# Patient Record
Sex: Male | Born: 1965 | Race: White | Hispanic: No | Smoking: Current every day smoker
Health system: Southern US, Community
[De-identification: ages and names within clinical notes are randomized; demographics above are authoritative.]

## PROBLEM LIST (undated history)

## (undated) DIAGNOSIS — K219 Gastro-esophageal reflux disease without esophagitis: Secondary | ICD-10-CM

## (undated) DIAGNOSIS — G8929 Other chronic pain: Secondary | ICD-10-CM

## (undated) DIAGNOSIS — K760 Fatty (change of) liver, not elsewhere classified: Secondary | ICD-10-CM

## (undated) DIAGNOSIS — M549 Dorsalgia, unspecified: Secondary | ICD-10-CM

## (undated) HISTORY — DX: Gastro-esophageal reflux disease without esophagitis: K21.9

## (undated) HISTORY — DX: Other chronic pain: G89.29

## (undated) HISTORY — PX: WISDOM TOOTH EXTRACTION: SHX21

## (undated) HISTORY — DX: Fatty (change of) liver, not elsewhere classified: K76.0

## (undated) HISTORY — PX: KNEE SURGERY: SHX244

## (undated) HISTORY — PX: BACK SURGERY: SHX140

## (undated) HISTORY — DX: Dorsalgia, unspecified: M54.9

## (undated) HISTORY — PX: APPENDECTOMY: SHX54

---

## 1998-01-31 ENCOUNTER — Emergency Department (HOSPITAL_COMMUNITY): Admission: EM | Admit: 1998-01-31 | Discharge: 1998-01-31 | Payer: Self-pay | Admitting: Emergency Medicine

## 2001-07-19 ENCOUNTER — Inpatient Hospital Stay (HOSPITAL_COMMUNITY): Admission: EM | Admit: 2001-07-19 | Discharge: 2001-07-22 | Payer: Self-pay

## 2001-07-20 ENCOUNTER — Encounter: Payer: Self-pay | Admitting: Orthopaedic Surgery

## 2014-11-14 ENCOUNTER — Ambulatory Visit: Payer: Self-pay

## 2014-11-14 DIAGNOSIS — M25521 Pain in right elbow: Secondary | ICD-10-CM | POA: Diagnosis not present

## 2014-11-14 DIAGNOSIS — S5001XA Contusion of right elbow, initial encounter: Secondary | ICD-10-CM | POA: Diagnosis not present

## 2014-11-14 DIAGNOSIS — M25421 Effusion, right elbow: Secondary | ICD-10-CM | POA: Diagnosis not present

## 2014-11-17 ENCOUNTER — Other Ambulatory Visit: Payer: Worker's Compensation

## 2014-11-17 DIAGNOSIS — S5001XD Contusion of right elbow, subsequent encounter: Secondary | ICD-10-CM | POA: Diagnosis not present

## 2014-11-24 ENCOUNTER — Other Ambulatory Visit: Payer: Worker's Compensation

## 2014-11-24 DIAGNOSIS — M25521 Pain in right elbow: Secondary | ICD-10-CM | POA: Diagnosis not present

## 2014-11-24 DIAGNOSIS — S5001XD Contusion of right elbow, subsequent encounter: Secondary | ICD-10-CM | POA: Diagnosis not present

## 2014-11-29 ENCOUNTER — Ambulatory Visit (INDEPENDENT_AMBULATORY_CARE_PROVIDER_SITE_OTHER): Payer: Worker's Compensation | Admitting: Family Medicine

## 2014-11-29 VITALS — BP 108/78 | HR 75 | Temp 98.2°F | Resp 16 | Ht 68.0 in | Wt 148.8 lb

## 2014-11-29 DIAGNOSIS — S5401XS Injury of ulnar nerve at forearm level, right arm, sequela: Secondary | ICD-10-CM | POA: Diagnosis not present

## 2014-11-29 NOTE — Progress Notes (Signed)
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 Distribution.  He has been taking the medrol dospak and seeing some improvement.  He has been limiting his use of the right arm and keeping it padded.  Objective:  Right elbow shows no ecchymosis or swelling.  He has tenderness right medial epicondyle. Right arm shows full range of motion  Assessment:  Good improvement  Plan:  Continue the padding Finish the Medrol  Recheck March 25  Friday  Robyn Haber, MD

## 2016-08-16 ENCOUNTER — Ambulatory Visit (INDEPENDENT_AMBULATORY_CARE_PROVIDER_SITE_OTHER): Payer: BLUE CROSS/BLUE SHIELD | Admitting: Family Medicine

## 2016-08-16 VITALS — BP 116/70 | HR 85 | Temp 97.6°F | Resp 18 | Ht 68.0 in | Wt 142.0 lb

## 2016-08-16 DIAGNOSIS — R0989 Other specified symptoms and signs involving the circulatory and respiratory systems: Secondary | ICD-10-CM | POA: Diagnosis not present

## 2016-08-16 DIAGNOSIS — F17200 Nicotine dependence, unspecified, uncomplicated: Secondary | ICD-10-CM | POA: Insufficient documentation

## 2016-08-16 DIAGNOSIS — R059 Cough, unspecified: Secondary | ICD-10-CM

## 2016-08-16 DIAGNOSIS — R05 Cough: Secondary | ICD-10-CM

## 2016-08-16 DIAGNOSIS — Z720 Tobacco use: Secondary | ICD-10-CM

## 2016-08-16 DIAGNOSIS — Z716 Tobacco abuse counseling: Secondary | ICD-10-CM | POA: Diagnosis not present

## 2016-08-16 DIAGNOSIS — J069 Acute upper respiratory infection, unspecified: Secondary | ICD-10-CM

## 2016-08-16 DIAGNOSIS — H9203 Otalgia, bilateral: Secondary | ICD-10-CM

## 2016-08-16 MED ORDER — NICOTINE 21 MG/24HR TD PT24: 21.0000 mg | MEDICATED_PATCH | Freq: Every day | TRANSDERMAL | 0 refills | Status: DC

## 2016-08-16 MED ORDER — NICOTINE 7 MG/24HR TD PT24: 7.0000 mg | MEDICATED_PATCH | Freq: Every day | TRANSDERMAL | 0 refills | Status: DC

## 2016-08-16 MED ORDER — FLUTICASONE PROPIONATE 50 MCG/ACT NA SUSP: 2.0000 | Freq: Every day | NASAL | 6 refills | Status: DC

## 2016-08-16 MED ORDER — NICOTINE 14 MG/24HR TD PT24: 14.0000 mg | MEDICATED_PATCH | Freq: Every day | TRANSDERMAL | 0 refills | Status: DC

## 2016-08-16 MED ORDER — CETIRIZINE HCL 10 MG PO CHEW: 10.0000 mg | CHEWABLE_TABLET | Freq: Every day | ORAL | 0 refills | Status: DC

## 2016-08-16 NOTE — Progress Notes (Signed)
Chief Complaint  Patient presents with  . Ear Pain    both at times, mainly left/x1 wk  . Dizziness    x 1 wk  . Nausea    x 1wk    HPI   Pt reports that he smokes 1 pack a day for the past 36 years. Reports that he has not smoked that much because his acute respiratory illness makes cigarettes taste bad. He tried cold [place] to quit in the past but reports his withdrawal was so bad he went back to smoking. He has heard bad things about chantix and is afraid to take  He feels like he would love to quit smoking He tends to smoke after eating  He now has a cough, dizziness, nausea and ear pain for one week The cough has been worse in the past couple days He reports that he is coughing up phlegm Denies any fevers or chills Reports a runny nose     No past medical history on file.  Current Outpatient Prescriptions  Medication Sig Dispense Refill  . cetirizine (ZYRTEC) 10 MG chewable tablet Chew 1 tablet (10 mg total) by mouth daily. 30 tablet 0  . fluticasone (FLONASE) 50 MCG/ACT nasal spray Place 2 sprays into both nostrils daily. 16 g 6  . MethylPREDNISolone (MEDROL, PAK, PO) Take by mouth. 8 day pak, as directed    . nicotine (NICODERM CQ - DOSED IN MG/24 HOURS) 14 mg/24hr patch Place 1 patch (14 mg total) onto the skin daily. Step 2. Use for 3 weeks. 28 patch 0  . nicotine (NICODERM CQ - DOSED IN MG/24 HOURS) 21 mg/24hr patch Place 1 patch (21 mg total) onto the skin daily. For 6 weeks 56 patch 0  . nicotine (NICODERM CQ - DOSED IN MG/24 HR) 7 mg/24hr patch Place 1 patch (7 mg total) onto the skin daily. Step 3. For 1-2 weeks 28 patch 0   No current facility-administered medications for this visit.     Allergies:  Allergies  Allergen Reactions  . Tamiflu [Oseltamivir] Hives    Past Surgical History:  Procedure Laterality Date  . APPENDECTOMY    . BACK SURGERY    . KNEE SURGERY      Social History   Social History  . Marital status: Legally Separated   Spouse name: N/A  . Number of children: N/A  . Years of education: N/A   Social History Main Topics  . Smoking status: Current Every Day Smoker    Packs/day: 0.50    Years: 30.00    Types: Cigarettes  . Smokeless tobacco: None  . Alcohol use No  . Drug use: No  . Sexual activity: Not Asked   Other Topics Concern  . None   Social History Narrative  . None    Review of Systems  Constitutional: Negative for chills and fever.  HENT: Positive for ear pain. Negative for congestion, ear discharge, hearing loss, sinus pain and sore throat.   Eyes: Negative for blurred vision and double vision.  Respiratory: Negative for cough, shortness of breath, wheezing and stridor.   Cardiovascular: Negative for chest pain, palpitations and orthopnea.  Gastrointestinal: Negative for abdominal pain, nausea and vomiting.  Skin: Negative for itching and rash.  Psychiatric/Behavioral: Negative for depression. The patient is not nervous/anxious.     Objective: Vitals:   08/16/16 0851  BP: 116/70  Pulse: 85  Resp: 18  Temp: 97.6 F (36.4 C)  TempSrc: Oral  SpO2: 98%  Weight: 142 lb (  64.4 kg)  Height: 5\' 8"  (1.727 m)    Physical Exam General: alert, oriented, in NAD Head: normocephalic, atraumatic, no sinus tenderness Eyes: EOM intact, no scleral icterus or conjunctival injection Ears: TM clear bilaterally but bulging TM on the right Throat: no pharyngeal exudate or erythema Lymph: no posterior auricular, submental or cervical lymph adenopathy Heart: normal rate, normal sinus rhythm, no murmurs Lungs: clear to auscultation bilaterally, no wheezing    Assessment and Plan Jesse King was seen today for ear pain, dizziness and nausea.  Diagnoses and all orders for this visit:   Cough- likely due to viral URI  Otalgia of both ears Runny nose -  Advised flonase and zyrtec  Encounter for smoking cessation counseling Tobacco use disorder-  discussed smoking cessation for 15  minutes Discussed usage and barriers to smoking Discussed common set backs Discussed how to use the patches Compared chantix, zyban and patches  Acute URI- likely viral and allergic rhinitis Discussed symptoms Continue smoking cessation Other orders -     nicotine (NICODERM CQ - DOSED IN MG/24 HOURS) 21 mg/24hr patch; Place 1 patch (21 mg total) onto the skin daily. For 6 weeks -     nicotine (NICODERM CQ - DOSED IN MG/24 HOURS) 14 mg/24hr patch; Place 1 patch (14 mg total) onto the skin daily. Step 2. Use for 3 weeks. -     nicotine (NICODERM CQ - DOSED IN MG/24 HR) 7 mg/24hr patch; Place 1 patch (7 mg total) onto the skin daily. Step 3. For 1-2 weeks -     cetirizine (ZYRTEC) 10 MG chewable tablet; Chew 1 tablet (10 mg total) by mouth daily. -     fluticasone (FLONASE) 50 MCG/ACT nasal spray; Place 2 sprays into both nostrils daily.     [name]

## 2016-08-16 NOTE — Patient Instructions (Addendum)
IF you received an x-ray today, you will receive an invoice from [facility]. Please contact [facility] at [phone] with questions or concerns regarding your invoice.   IF you received labwork today, you will receive an invoice from [employer]. Please contact [facility] at [phone] with questions or concerns regarding your invoice.   Our billing staff will not be able to assist you with questions regarding bills from these companies.  You will be contacted with the lab results as soon as they are available. The fastest way to get your results is to activate your My Chart account. Instructions are located on the last page of this paperwork. If you have not heard from [place] regarding the results in 2 weeks, please contact this office.      Steps to Quit Smoking Smoking tobacco can be bad for your health. It can also affect almost every organ in your body. Smoking puts you and people around you at risk for many serious long-lasting (chronic) diseases. Quitting smoking is hard, but it is one of the best things that you can do for your health. It is never too late to quit. What are the benefits of quitting smoking? When you quit smoking, you lower your risk for getting serious diseases and conditions. They can include:  Lung cancer or lung disease.  Heart disease.  Stroke.  Heart attack.  Not being able to have children (infertility).  Weak bones (osteoporosis) and broken bones (fractures). If you have coughing, wheezing, and shortness of breath, those symptoms may get better when you quit. You may also get sick less often. If you are pregnant, quitting smoking can help to lower your chances of having a baby of low birth weight. What can I do to help me quit smoking? Talk with your doctor about what can help you quit smoking. Some things you can do (strategies) include:  Quitting smoking totally, instead of slowly cutting back how  much you smoke over a period of time.  Going to in-person counseling. You are more likely to quit if you go to many counseling sessions.  Using resources and support systems, such as:  Online chats with a Social worker.  Phone quitlines.  Printed Furniture conservator/restorer.  Support groups or group counseling.  Text messaging programs.  Mobile phone apps or applications.  Taking medicines. Some of these medicines may have nicotine in them. If you are pregnant or breastfeeding, do not take any medicines to quit smoking unless your doctor says it is okay. Talk with your doctor about counseling or other things that can help you. Talk with your doctor about using more than one strategy at the same time, such as taking medicines while you are also going to in-person counseling. This can help make quitting easier. What things can I do to make it easier to quit? Quitting smoking might feel very hard at first, but there is a lot that you can do to make it easier. Take these steps:  Talk to your family and friends. Ask them to support and encourage you.  Call phone quitlines, reach out to support groups, or work with a Social worker.  Ask people who smoke to not smoke around you.  Avoid places that make you want (trigger) to smoke, such as:  Bars.  Parties.  Smoke-break areas at work.  Spend time with people who do not smoke.  Lower the stress in your life. Stress can make you want to smoke. Try these things to  help your stress:  Getting regular exercise.  Deep-breathing exercises.  Yoga.  Meditating.  Doing a body scan. To do this, close your eyes, focus on one area of your body at a time from head to toe, and notice which parts of your body are tense. Try to relax the muscles in those areas.  Download or buy apps on your mobile phone or tablet that can help you stick to your quit plan. There are many free apps, such as QuitGuide from the [employer] Office manager for Disease Control and Prevention).  You can find more support from smokefree.gov and other websites. This information is not intended to replace advice given to you by your health care provider. Make sure you discuss any questions you have with your health care provider. Document Released: 06/26/2009 Document Revised: 04/27/2016 Document Reviewed: 01/14/2015 Elsevier Interactive Patient Education  2017 Elsevier Inc. Nicotine skin patches What is this medicine? NICOTINE ([place] teen) helps people stop smoking. The patches replace the nicotine found in cigarettes and help to decrease withdrawal effects. They are most effective when used in combination with a stop-smoking program. This medicine may be used for other purposes; ask your health care provider or pharmacist if you have questions. COMMON BRAND NAME(S): Habitrol, Nicoderm CQ, Nicotrol What should I tell my health care provider before I take this medicine? They need to know if you have any of these conditions: -diabetes -heart disease, angina, irregular heartbeat or previous heart attack -high blood pressure -lung disease, including asthma -overactive thyroid -pheochromocytoma -seizures or a history of seizures -skin problems, like eczema -stomach problems or ulcers -an unusual or allergic reaction to nicotine, adhesives, other medicines, foods, dyes, or preservatives -pregnant or trying to get pregnant -breast-feeding How should I use this medicine? This medicine is for use on the skin. Follow the directions that come with the patches. Find an area of skin on your upper arm, chest, or back that is clean, dry, greaseless, undamaged and hairless. Wash hands with plain soap and water. Do not use anything that contains aloe, lanolin or glycerin as these may prevent the patch from sticking. Dry thoroughly. Remove the patch from the sealed pouch. Do not try to cut or trim the patch. Using your palm, press the patch firmly in place for 10 seconds to make sure that there is  good contact with your skin. After applying the patch, wash your hands. Change the patch every day, keeping to a regular schedule. When you apply a new patch, use a new area of skin. Wait at least 1 week before using the same area again. Talk to your pediatrician regarding the use of this medicine in children. Special care may be needed. Overdosage: If you think you have taken too much of this medicine contact a poison control center or emergency room at once. NOTE: This medicine is only for you. Do not share this medicine with others. What if I miss a dose? If you forget to replace a patch, use it as soon as you can. Only use one patch at a time and do not leave on the skin for longer than directed. If a patch falls off, you can replace it, but keep to your schedule and remove the patch at the right time. What may interact with this medicine? -medicines for asthma -medicines for blood pressure -medicines for mental depression This list may not describe all possible interactions. Give your health care provider a list of all the medicines, herbs, non-prescription drugs, or dietary supplements  you use. Also tell them if you smoke, drink alcohol, or use illegal drugs. Some items may interact with your medicine. What should I watch for while using this medicine? You should begin using the nicotine patch the day you stop smoking. It is okay if you do not succeed at your attempt to quit and have a cigarette. You can still continue your quit attempt and keep using the product as directed. Just throw away your cigarettes and get back to your quit plan. You can keep the patch in place during swimming, bathing, and showering. If your patch falls off during these activities, replace it. When you first apply the patch, your skin may itch or burn. This should go away soon. When you remove a patch, the skin may look red, but this should only last for a few days. Call your doctor or health care professional if skin  redness does not go away after 4 days, if your skin swells, or if you get a rash. If you are a diabetic and you quit smoking, the effects of insulin may be increased and you may need to reduce your insulin dose. Check with your doctor or health care professional about how you should adjust your insulin dose. If you are going to have a magnetic resonance imaging (MRI) procedure, tell your MRI technician if you have this patch on your body. It must be removed before a MRI. What side effects may I notice from receiving this medicine? Side effects that you should report to your doctor or health care professional as soon as possible: -allergic reactions like skin rash, itching or hives, swelling of the face, lips, or tongue -breathing problems -changes in hearing -changes in vision -chest pain -cold sweats -confusion -fast, irregular heartbeat -feeling faint or lightheaded, falls -headache -increased saliva -skin redness that lasts more than 4 days -stomach pain -signs and symptoms of nicotine overdose like nausea; vomiting; dizziness; weakness; and rapid heartbeat Side effects that usually do not require medical attention (report to your doctor or health care professional if they continue or are bothersome): -diarrhea -dry mouth -hiccups -irritability -nervousness or restlessness -trouble sleeping or vivid dreams This list may not describe all possible side effects. Call your doctor for medical advice about side effects. You may report side effects to FDA at 1-800-FDA-1088. Where should I keep my medicine? Keep out of the reach of children. Store at room temperature between 20 and 25 degrees C (68 and 77 degrees F). Protect from heat and light. Store in International aid/development worker until ready to use. Throw away unused medicine after the expiration date. When you remove a patch, fold with sticky sides together; put in an empty opened pouch and throw away. NOTE: This sheet is a summary. It may  not cover all possible information. If you have questions about this medicine, talk to your doctor, pharmacist, or health care provider.  2017 Elsevier/Gold Standard (2014-07-29 15:46:21)

## 2016-09-29 ENCOUNTER — Ambulatory Visit: Payer: Self-pay | Admitting: Family Medicine

## 2016-11-05 ENCOUNTER — Encounter (HOSPITAL_COMMUNITY): Payer: Self-pay | Admitting: Emergency Medicine

## 2016-11-05 ENCOUNTER — Emergency Department (HOSPITAL_COMMUNITY): Payer: BLUE CROSS/BLUE SHIELD

## 2016-11-05 ENCOUNTER — Emergency Department (HOSPITAL_COMMUNITY)
Admission: EM | Admit: 2016-11-05 | Discharge: 2016-11-05 | Disposition: A | Discharge status: Home / Self Care | Payer: BLUE CROSS/BLUE SHIELD | Attending: Emergency Medicine | Admitting: Emergency Medicine

## 2016-11-05 DIAGNOSIS — R1032 Left lower quadrant pain: Secondary | ICD-10-CM | POA: Insufficient documentation

## 2016-11-05 DIAGNOSIS — Z79899 Other long term (current) drug therapy: Secondary | ICD-10-CM | POA: Insufficient documentation

## 2016-11-05 DIAGNOSIS — F17210 Nicotine dependence, cigarettes, uncomplicated: Secondary | ICD-10-CM | POA: Insufficient documentation

## 2016-11-05 DIAGNOSIS — R109 Unspecified abdominal pain: Secondary | ICD-10-CM

## 2016-11-05 LAB — COMPREHENSIVE METABOLIC PANEL
ALK PHOS: 60 U/L (ref 38–126)
ALT: 13 U/L — AB (ref 17–63)
AST: 14 U/L — AB (ref 15–41)
Albumin: 4.2 g/dL (ref 3.5–5.0)
Anion gap: 6 (ref 5–15)
BILIRUBIN TOTAL: 0.8 mg/dL (ref 0.3–1.2)
BUN: 11 mg/dL (ref 6–20)
CALCIUM: 9.3 mg/dL (ref 8.9–10.3)
CO2: 26 mmol/L (ref 22–32)
CREATININE: 0.76 mg/dL (ref 0.61–1.24)
Chloride: 108 mmol/L (ref 101–111)
GFR calc Af Amer: >60 mL/min (ref >60)
Glucose, Bld: 94 mg/dL (ref 65–99)
Potassium: 3.8 mmol/L (ref 3.5–5.1)
Sodium: 140 mmol/L (ref 135–145)
Total Protein: 7.3 g/dL (ref 6.5–8.1)

## 2016-11-05 LAB — URINALYSIS, ROUTINE W REFLEX MICROSCOPIC
Bilirubin Urine: NEGATIVE
GLUCOSE, UA: NEGATIVE mg/dL
HGB URINE DIPSTICK: NEGATIVE
KETONES UR: NEGATIVE mg/dL
LEUKOCYTES UA: NEGATIVE
Nitrite: NEGATIVE
PROTEIN: NEGATIVE mg/dL
Specific Gravity, Urine: 1.019 (ref 1.005–1.030)
pH: 5 (ref 5.0–8.0)

## 2016-11-05 LAB — CBC
HCT: 41.4 % (ref 39.0–52.0)
Hemoglobin: 14 g/dL (ref 13.0–17.0)
MCH: 32.1 pg (ref 26.0–34.0)
MCHC: 33.8 g/dL (ref 30.0–36.0)
MCV: 95 fL (ref 78.0–100.0)
PLATELETS: 301 10*3/uL (ref 150–400)
RBC: 4.36 MIL/uL (ref 4.22–5.81)
RDW: 13.4 % (ref 11.5–15.5)
WBC: 7.9 10*3/uL (ref 4.0–10.5)

## 2016-11-05 LAB — LIPASE, BLOOD: Lipase: 26 U/L (ref 11–51)

## 2016-11-05 MED ORDER — MORPHINE SULFATE (PF) 4 MG/ML IV SOLN
4.0000 mg | Freq: Once | INTRAVENOUS | Status: AC
  Administered 2016-11-05: 4 mg via INTRAVENOUS
  Filled 2016-11-05: qty 1

## 2016-11-05 MED ORDER — ONDANSETRON HCL 4 MG/2ML IJ SOLN
4.0000 mg | Freq: Once | INTRAMUSCULAR | Status: AC
  Administered 2016-11-05: 4 mg via INTRAVENOUS
  Filled 2016-11-05: qty 2

## 2016-11-05 MED ORDER — NAPROXEN 375 MG PO TABS: 375.0000 mg | ORAL_TABLET | Freq: Two times a day (BID) | ORAL | 0 refills | Status: DC

## 2016-11-05 NOTE — ED Notes (Signed)
RN will collect labs at time of IV start 

## 2016-11-05 NOTE — ED Triage Notes (Signed)
Pt complaint of left flank pain onset an hour ago with associated nausea. Pt denies v/d or urinary symptoms. Pt denies hx of kidney.

## 2016-11-05 NOTE — ED Provider Notes (Signed)
[facility] DEPT Provider Note   CSN: [id number] Arrival date & time: 11/05/16  1036     History   Chief Complaint Chief Complaint  Patient presents with  . Flank Pain    HPI Jesse King is a 51 y.o. male.  Patient is a 51 year old male with abrupt onset of severe sharp and knifelike pain that is 8 out of 10 that started suddenly approximately 90 minutes ago. It is on his left side and does not radiate. He denies any urinary or bowel issues. Since the pain started he's been slightly nauseated. He denies ever having pain like this before. He does not take any medications regularly and has not taken anything for this pain. It is slightly better than when it first began but is still a 6 out of 10. He denies any shortness of breath or chest pain. No lower extremity symptoms and pain does not radiate to the testicle. He denies any lifting or trauma prior to the pain starting   The history is provided by the patient.  Flank Pain     History reviewed. No pertinent past medical history.  Patient Active Problem List   Diagnosis Date Noted  . Nicotine addiction 08/16/2016  . Tobacco use disorder 08/16/2016    Past Surgical History:  Procedure Laterality Date  . APPENDECTOMY    . BACK SURGERY    . KNEE SURGERY         Home Medications    Prior to Admission medications   Medication Sig Start Date End Date Taking? Authorizing Provider  cetirizine (ZYRTEC) 10 MG chewable tablet Chew 1 tablet (10 mg total) by mouth daily. Patient not taking: Reported on 11/05/2016 08/16/16   Forrest Moron, MD  fluticasone (FLONASE) 50 MCG/ACT nasal spray Place 2 sprays into both nostrils daily. Patient not taking: Reported on 11/05/2016 08/16/16   Forrest Moron, MD  nicotine (NICODERM CQ - DOSED IN MG/24 HOURS) 14 mg/24hr patch Place 1 patch (14 mg total) onto the skin daily. Step 2. Use for 3 weeks. Patient not taking: Reported on 11/05/2016 08/16/16   Forrest Moron, MD  nicotine  (NICODERM CQ - DOSED IN MG/24 HOURS) 21 mg/24hr patch Place 1 patch (21 mg total) onto the skin daily. For 6 weeks Patient not taking: Reported on 11/05/2016 08/16/16   Forrest Moron, MD  nicotine (NICODERM CQ - DOSED IN MG/24 HR) 7 mg/24hr patch Place 1 patch (7 mg total) onto the skin daily. Step 3. For 1-2 weeks Patient not taking: Reported on 11/05/2016 08/16/16   Forrest Moron, MD    Family History Family History  Problem Relation Age of Onset  . Diabetes Father   . Heart disease Father     Social History Social History  Substance Use Topics  . Smoking status: Current Every Day Smoker    Packs/day: 0.50    Years: 30.00    Types: Cigarettes  . Smokeless tobacco: Not on file  . Alcohol use No     Allergies   Codeine and Tamiflu [oseltamivir]   Review of Systems Review of Systems  Genitourinary: Positive for flank pain.  All other systems reviewed and are negative.    Physical Exam Updated Vital Signs BP 132/79 (BP Location: Left Arm)   Pulse 80   Temp 97.9 F (36.6 C) (Oral)   Resp 16   Wt 148 lb (67.1 kg)   SpO2 100%   BMI 22.50 kg/m   Physical Exam  Constitutional: He  is oriented to person, place, and time. He appears well-developed and well-nourished. No distress.  HENT:  Head: Normocephalic and atraumatic.  Mouth/Throat: Oropharynx is clear and moist.  Eyes: Conjunctivae and EOM are normal. Pupils are equal, round, and reactive to light.  Neck: Normal range of motion. Neck supple.  Cardiovascular: Normal rate, regular rhythm and intact distal pulses.   No murmur heard. Pulmonary/Chest: Effort normal and breath sounds normal. No respiratory distress. He has no wheezes. He has no rales.  Abdominal: Soft. He exhibits no distension. There is tenderness in the left lower quadrant. There is CVA tenderness. There is no rebound and no guarding. No hernia.  Left flank discomfort and pain in the left lower quadrant with mild guarding no rebound    Musculoskeletal: Normal range of motion. He exhibits no edema or tenderness.  Neurological: He is alert and oriented to person, place, and time.  Skin: Skin is warm and dry. No rash noted. No erythema.  Psychiatric: He has a normal mood and affect. His behavior is normal.  Nursing note and vitals reviewed.    ED Treatments / Results  Labs (all labs ordered are listed, but only abnormal results are displayed) Labs Reviewed  COMPREHENSIVE METABOLIC PANEL - Abnormal; Notable for the following:       Result Value   AST 14 (*)    ALT 13 (*)    All other components within normal limits  LIPASE, BLOOD  CBC  URINALYSIS, ROUTINE W REFLEX MICROSCOPIC    EKG  EKG Interpretation None       Radiology No results found.  Procedures Procedures (including critical care time)  Medications Ordered in ED Medications  morphine 4 MG/ML injection 4 mg (4 mg Intravenous Given 11/05/16 1134)  ondansetron (ZOFRAN) injection 4 mg (4 mg Intravenous Given 11/05/16 1134)     Initial Impression / Assessment and Plan / ED Course  I have reviewed the triage vital signs and the nursing notes.  Pertinent labs & imaging results that were available during my care of the patient were reviewed by me and considered in my medical decision making (see chart for details).     Pt with symptoms consistent with kidney stone.  Denies infectious sx, or GI symptoms.  Low concern for diverticulitis and no history suggestive of AAA.  No hx suggestive of GU source (discharge) and otherwise pt is healthy.  Will hydrate, treat pain and ensure no infection with UA, CBC, BMP and will get stone study to further eval.  12:48 PM Labs wnl.  Pt's pain resolved.  Maybe MSK or small passed stone.  Will d/c home.  Final Clinical Impressions(s) / ED Diagnoses   Final diagnoses:  Left sided abdominal pain    New Prescriptions New Prescriptions   NAPROXEN (NAPROSYN) 375 MG TABLET    Take 1 tablet (375 mg total) by mouth  2 (two) times daily with a meal.     Blanchie Dessert, MD 11/05/16 1251

## 2017-02-15 ENCOUNTER — Emergency Department (HOSPITAL_COMMUNITY)
Admission: EM | Admit: 2017-02-15 | Discharge: 2017-02-15 | Disposition: A | Discharge status: Home / Self Care | Payer: BLUE CROSS/BLUE SHIELD | Attending: Emergency Medicine | Admitting: Emergency Medicine

## 2017-02-15 ENCOUNTER — Emergency Department (HOSPITAL_COMMUNITY): Payer: BLUE CROSS/BLUE SHIELD

## 2017-02-15 ENCOUNTER — Encounter (HOSPITAL_COMMUNITY): Payer: Self-pay

## 2017-02-15 DIAGNOSIS — F17210 Nicotine dependence, cigarettes, uncomplicated: Secondary | ICD-10-CM | POA: Insufficient documentation

## 2017-02-15 DIAGNOSIS — R1032 Left lower quadrant pain: Secondary | ICD-10-CM | POA: Insufficient documentation

## 2017-02-15 LAB — CBC
HCT: 41.8 % (ref 39.0–52.0)
Hemoglobin: 14.3 g/dL (ref 13.0–17.0)
MCH: 32.3 pg (ref 26.0–34.0)
MCHC: 34.2 g/dL (ref 30.0–36.0)
MCV: 94.4 fL (ref 78.0–100.0)
PLATELETS: 272 10*3/uL (ref 150–400)
RBC: 4.43 MIL/uL (ref 4.22–5.81)
RDW: 13 % (ref 11.5–15.5)
WBC: 7.5 10*3/uL (ref 4.0–10.5)

## 2017-02-15 LAB — COMPREHENSIVE METABOLIC PANEL
ALBUMIN: 4.2 g/dL (ref 3.5–5.0)
ALT: 14 U/L — AB (ref 17–63)
AST: 16 U/L (ref 15–41)
Alkaline Phosphatase: 73 U/L (ref 38–126)
Anion gap: 11 (ref 5–15)
BUN: 12 mg/dL (ref 6–20)
CHLORIDE: 105 mmol/L (ref 101–111)
CO2: 23 mmol/L (ref 22–32)
Calcium: 9.2 mg/dL (ref 8.9–10.3)
Creatinine, Ser: 0.85 mg/dL (ref 0.61–1.24)
GFR calc non Af Amer: >60 mL/min (ref >60)
GLUCOSE: 95 mg/dL (ref 65–99)
Potassium: 3.8 mmol/L (ref 3.5–5.1)
SODIUM: 139 mmol/L (ref 135–145)
Total Bilirubin: 0.9 mg/dL (ref 0.3–1.2)
Total Protein: 7.2 g/dL (ref 6.5–8.1)

## 2017-02-15 LAB — LIPASE, BLOOD: LIPASE: 26 U/L (ref 11–51)

## 2017-02-15 MED ORDER — IOPAMIDOL (ISOVUE-300) INJECTION 61%
100.0000 mL | Freq: Once | INTRAVENOUS | Status: AC | PRN
  Administered 2017-02-15: 100 mL via INTRAVENOUS

## 2017-02-15 MED ORDER — TRAMADOL HCL 50 MG PO TABS: 50.0000 mg | ORAL_TABLET | Freq: Four times a day (QID) | ORAL | 0 refills | Status: DC | PRN

## 2017-02-15 MED ORDER — MORPHINE SULFATE (PF) 2 MG/ML IV SOLN
4.0000 mg | Freq: Once | INTRAVENOUS | Status: DC
  Filled 2017-02-15: qty 2

## 2017-02-15 MED ORDER — IBUPROFEN 800 MG PO TABS: 800.0000 mg | ORAL_TABLET | Freq: Three times a day (TID) | ORAL | 0 refills | Status: AC | PRN

## 2017-02-15 MED ORDER — KETOROLAC TROMETHAMINE 30 MG/ML IJ SOLN
30.0000 mg | Freq: Once | INTRAMUSCULAR | Status: AC
  Administered 2017-02-15: 30 mg via INTRAVENOUS
  Filled 2017-02-15: qty 1

## 2017-02-15 MED ORDER — IOPAMIDOL (ISOVUE-300) INJECTION 61%
INTRAVENOUS | Status: AC
  Filled 2017-02-15: qty 100

## 2017-02-15 NOTE — ED Triage Notes (Signed)
Patient c/o LLQ burning pain around 0830 today. Patient denies any N/V/D.

## 2017-02-15 NOTE — ED Provider Notes (Signed)
Emergency Department Provider Note   I have reviewed the triage vital signs and the nursing notes.   HISTORY  Chief Complaint Abdominal Pain   HPI Jesse King is a 51 y.o. male with PMH of tobacco use presents to the emergency department for evaluation of sudden onset left lower quadrant abdominal pain. The pain is burning in quality. Symptoms began suddenly this morning and have been constant. Initially he had some radiation to the back but not currently. He is worse with movement or pressing on the area. No alleviating factors. Pain is moderate in severity. He went to urgent care prior to ED presentation was referred here after obtaining a normal urinalysis. Patient has no known history of kidney stone or diverticulitis. No fevers, chills, nausea, vomiting, diarrhea. No chest pain or difficulty breathing. No similar pain in the past. No rash in the area of pain.    History reviewed. No pertinent past medical history.  Patient Active Problem List   Diagnosis Date Noted  . Nicotine addiction 08/16/2016  . Tobacco use disorder 08/16/2016    Past Surgical History:  Procedure Laterality Date  . APPENDECTOMY    . BACK SURGERY    . KNEE SURGERY      Current Outpatient Rx  . Order #: [id number] Class: Normal  . Order #: [id number] Class: Normal  . Order #: [id number] Class: Print  . Order #: [id number] Class: Print  . Order #: [id number] Class: Normal  . Order #: [id number] Class: Normal  . Order #: [id number] Class: Normal  . Order #: [id number] Class: Print    Allergies Codeine and Tamiflu [oseltamivir]  Family History  Problem Relation Age of Onset  . Diabetes Father   . Heart disease Father     Social History Social History  Substance Use Topics  . Smoking status: Current Every Day Smoker    Packs/day: 0.50    Years: 30.00    Types: Cigarettes  . Smokeless tobacco: Never Used  . Alcohol use No    Review of Systems  Constitutional: No fever/chills Eyes: No visual  changes. ENT: No sore throat. Cardiovascular: Denies chest pain. Respiratory: Denies shortness of breath. Gastrointestinal: Positive LLQ abdominal pain.  No nausea, no vomiting.  No diarrhea.  No constipation. Genitourinary: Negative for dysuria. Musculoskeletal: Negative for back pain. Skin: Negative for rash. Neurological: Negative for headaches, focal weakness or numbness.  10-point ROS otherwise negative.  ____________________________________________   PHYSICAL EXAM:  VITAL SIGNS: ED Triage Vitals  Enc Vitals Group     BP 02/15/17 1037 117/86     Pulse Rate 02/15/17 1037 87     Resp 02/15/17 1037 16     Temp 02/15/17 1037 97.7 F (36.5 C)     Temp Source 02/15/17 1037 Oral     SpO2 02/15/17 1037 100 %     Weight 02/15/17 1037 147 lb 6 oz (66.8 kg)     Height 02/15/17 1037 5\' 6"  (1.676 m)     Pain Score 02/15/17 1036 7   Constitutional: Alert and oriented. Well appearing and in no acute distress. Eyes: Conjunctivae are normal.  Head: Atraumatic. Nose: No congestion/rhinnorhea. Mouth/Throat: Mucous membranes are moist.  Oropharynx non-erythematous. Neck: No stridor. Cardiovascular: Normal rate, regular rhythm. Good peripheral circulation. Grossly normal heart sounds.   Respiratory: Normal respiratory effort.  No retractions. Lungs CTAB. Gastrointestinal: Soft with focal LLQ tenderness to palpation. No rebound or guarding. No distention.  Musculoskeletal: No lower extremity tenderness nor edema. No gross deformities of extremities. Neurologic:  Normal speech and  language. No gross focal neurologic deficits are appreciated.  Skin:  Skin is warm, dry and intact. No rash noted. Psychiatric: Mood and affect are normal. Speech and behavior are normal.  ____________________________________________   LABS (all labs ordered are listed, but only abnormal results are displayed)  Labs Reviewed  COMPREHENSIVE METABOLIC PANEL - Abnormal; Notable for the following:       Result  Value   ALT 14 (*)    All other components within normal limits  LIPASE, BLOOD  CBC   ____________________________________________  RADIOLOGY  Ct Abdomen Pelvis W Contrast  Result Date: 02/15/2017 CLINICAL DATA:  Left lower quadrant acute abdominal pain. Remote appendectomy. EXAM: CT ABDOMEN AND PELVIS WITH CONTRAST TECHNIQUE: Multidetector CT imaging of the abdomen and pelvis was performed using the standard protocol following bolus administration of intravenous contrast. CONTRAST:  121mL ISOVUE-300 IOPAMIDOL (ISOVUE-300) INJECTION 61% COMPARISON:  11/05/2016 FINDINGS: Lower chest: Minor dependent bibasilar atelectasis. No lower lobe pneumonia, collapse or consolidation. Normal heart size. No pericardial or pleural effusion Hepatobiliary: Mild focal fatty infiltration of the liver along the falciform ligament, image 15. No other hepatic abnormality or biliary dilatation. Patent hepatic and portal veins. Gallbladder and biliary system unremarkable. Pancreas: Unremarkable. No pancreatic ductal dilatation or surrounding inflammatory changes. Spleen: Normal in size without focal abnormality. Adrenals/Urinary Tract: Adrenal glands are unremarkable. Kidneys are normal, without renal calculi, focal lesion, or hydronephrosis. Bladder is unremarkable. Stomach/Bowel: Stomach is within normal limits. Appendix not visualized. No evidence of bowel wall thickening, distention, or inflammatory changes. Vascular/Lymphatic: Aortoiliac bifurcation atherosclerosis. Negative for aneurysm or occlusive process. No retroperitoneal hemorrhage or hematoma. No adenopathy. Reproductive: Prostate and seminal vesicles unremarkable. Other: No abdominal wall hernia or abnormality. No abdominopelvic ascites. Musculoskeletal: Degenerative changes of the lumbar spine, most pronounced at L5-S1. No acute osseous finding. IMPRESSION: No acute intra-abdominal or pelvic finding. 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  Shick M.D.   On: 02/15/2017 13:22    ____________________________________________   PROCEDURES  Procedure(s) performed:   Procedures  None ____________________________________________   INITIAL IMPRESSION / ASSESSMENT AND PLAN / ED COURSE  Pertinent labs & imaging results that were available during my care of the patient were reviewed by me and considered in my medical decision making (see chart for details).  Patient presents to the emergency department for evaluation of burning left lower quadrant pain that started this morning. He has focal tenderness in this area without rebound or guarding. No overlying rash. Lower clinical suspicion for kidney stone given constant symptoms that are worse with palpation. Plan for CT scan of the abdomen and pelvis to rule out complicated diverticulitis.   01:43 PM Patient with improved pain. Normal CT abdomen/pelvis. Plan for Tramadol for severe pain but mostly Tylenol/Motrin/heating pad.   At this time, I do not feel there is any life-threatening condition present. I have reviewed and discussed all results (EKG, imaging, lab, urine as appropriate), exam findings with patient. I have reviewed nursing notes and appropriate previous records.  I feel the patient is safe to be discharged home without further emergent workup. Discussed usual and customary return precautions. Patient and family (if present) verbalize understanding and are comfortable with this plan.  Patient will follow-up with their primary care provider. If they do not have a primary care provider, information for follow-up has been provided to them. All questions have been answered.  ____________________________________________  FINAL CLINICAL IMPRESSION(S) / ED DIAGNOSES  Final  diagnoses:  Left lower quadrant pain     MEDICATIONS GIVEN DURING THIS VISIT:  Medications  ketorolac (TORADOL) 30 MG/ML injection  30 mg (30 mg Intravenous Given 02/15/17 1209)  iopamidol (ISOVUE-300) 61 % injection 100 mL (100 mLs Intravenous Contrast Given 02/15/17 1232)     NEW OUTPATIENT MEDICATIONS STARTED DURING THIS VISIT:  Discharge Medication List as of 02/15/2017  1:44 PM    START taking these medications   Details  ibuprofen (ADVIL,MOTRIN) 800 MG tablet Take 1 tablet (800 mg total) by mouth every 8 (eight) hours as needed., Starting Tue 02/15/2017, Print    traMADol (ULTRAM) 50 MG tablet Take 1 tablet (50 mg total) by mouth every 6 (six) hours as needed., Starting Tue 02/15/2017, Print        Note:  This document was prepared using Dragon voice recognition software and may include unintentional dictation errors.  Nanda Quinton, MD Emergency Medicine   Cesily Cuoco, [name], MD 02/15/17 [phone]

## 2017-02-15 NOTE — Discharge Instructions (Signed)

## 2017-09-19 ENCOUNTER — Encounter (HOSPITAL_COMMUNITY): Payer: Self-pay

## 2017-09-19 ENCOUNTER — Other Ambulatory Visit: Payer: Self-pay

## 2017-09-19 ENCOUNTER — Emergency Department (HOSPITAL_COMMUNITY)
Admission: EM | Admit: 2017-09-19 | Discharge: 2017-09-19 | Disposition: A | Discharge status: Home / Self Care | Payer: BLUE CROSS/BLUE SHIELD | Attending: Emergency Medicine | Admitting: Emergency Medicine

## 2017-09-19 DIAGNOSIS — Z5321 Procedure and treatment not carried out due to patient leaving prior to being seen by health care provider: Secondary | ICD-10-CM | POA: Diagnosis not present

## 2017-09-19 DIAGNOSIS — R1032 Left lower quadrant pain: Secondary | ICD-10-CM | POA: Insufficient documentation

## 2017-09-19 LAB — COMPREHENSIVE METABOLIC PANEL
ALT: 15 U/L — ABNORMAL LOW (ref 17–63)
AST: 14 U/L — AB (ref 15–41)
Albumin: 4 g/dL (ref 3.5–5.0)
Alkaline Phosphatase: 76 U/L (ref 38–126)
Anion gap: 7 (ref 5–15)
BUN: 15 mg/dL (ref 6–20)
CHLORIDE: 105 mmol/L (ref 101–111)
CO2: 28 mmol/L (ref 22–32)
Calcium: 9.5 mg/dL (ref 8.9–10.3)
Creatinine, Ser: 0.85 mg/dL (ref 0.61–1.24)
GFR calc Af Amer: >60 mL/min (ref >60)
Glucose, Bld: 99 mg/dL (ref 65–99)
POTASSIUM: 4.1 mmol/L (ref 3.5–5.1)
Sodium: 140 mmol/L (ref 135–145)
Total Bilirubin: 1.2 mg/dL (ref 0.3–1.2)
Total Protein: 7.3 g/dL (ref 6.5–8.1)

## 2017-09-19 LAB — CBC
HEMATOCRIT: 43.7 % (ref 39.0–52.0)
Hemoglobin: 14.6 g/dL (ref 13.0–17.0)
MCH: 32.4 pg (ref 26.0–34.0)
MCHC: 33.4 g/dL (ref 30.0–36.0)
MCV: 96.9 fL (ref 78.0–100.0)
Platelets: 301 10*3/uL (ref 150–400)
RBC: 4.51 MIL/uL (ref 4.22–5.81)
RDW: 13.2 % (ref 11.5–15.5)
WBC: 10.4 10*3/uL (ref 4.0–10.5)

## 2017-09-19 LAB — LIPASE, BLOOD: LIPASE: 27 U/L (ref 11–51)

## 2017-09-19 NOTE — ED Triage Notes (Signed)
Patient c/o left mid lower abdominal pain and nausea x 2 hours. Sudden onset.

## 2017-09-19 NOTE — ED Notes (Signed)
Patient gave patient labels to registration saying they are no longer waiting.

## 2017-10-10 ENCOUNTER — Ambulatory Visit (INDEPENDENT_AMBULATORY_CARE_PROVIDER_SITE_OTHER): Payer: BLUE CROSS/BLUE SHIELD

## 2017-10-10 ENCOUNTER — Encounter: Payer: Self-pay | Admitting: Physician Assistant

## 2017-10-10 ENCOUNTER — Ambulatory Visit: Payer: BLUE CROSS/BLUE SHIELD | Admitting: Physician Assistant

## 2017-10-10 VITALS — BP 118/60 | HR 87 | Temp 98.8°F | Resp 16 | Ht 68.0 in | Wt 153.0 lb

## 2017-10-10 DIAGNOSIS — R1032 Left lower quadrant pain: Secondary | ICD-10-CM

## 2017-10-10 LAB — POC MICROSCOPIC URINALYSIS (UMFC): MUCUS RE: ABSENT

## 2017-10-10 LAB — POCT URINALYSIS DIP (MANUAL ENTRY)
BILIRUBIN UA: NEGATIVE
BILIRUBIN UA: NEGATIVE mg/dL
Glucose, UA: NEGATIVE mg/dL
LEUKOCYTES UA: NEGATIVE
NITRITE UA: NEGATIVE
Protein Ur, POC: NEGATIVE mg/dL
Spec Grav, UA: 1.025 (ref 1.010–1.025)
Urobilinogen, UA: 0.2 E.U./dL
pH, UA: 6.5 (ref 5.0–8.0)

## 2017-10-10 MED ORDER — POLYETHYLENE GLYCOL 3350 17 GM/SCOOP PO POWD: 17.0000 g | Freq: Two times a day (BID) | ORAL | 1 refills | Status: DC | PRN

## 2017-10-10 MED ORDER — OMEPRAZOLE 20 MG PO CPDR: 20.0000 mg | DELAYED_RELEASE_CAPSULE | Freq: Every day | ORAL | 3 refills | Status: DC

## 2017-10-10 NOTE — Patient Instructions (Addendum)
I am referring you to gastroenterology for this chronic symptoms Please try this regimen for now. Take the miralax for the next 5 days.  No longer than 7. Take the omeprazole as prescribed.   Follow up with me in 2 weeks, unless you have a gastroenterology referral.      IF you received an x-ray today, you will receive an invoice from [facility]. Please contact [facility] at [phone] with questions or concerns regarding your invoice.   IF you received labwork today, you will receive an invoice from [facility]. Please contact [facility] at [phone] with questions or concerns regarding your invoice.   Our billing staff will not be able to assist you with questions regarding bills from these companies.  You will be contacted with the lab results as soon as they are available. The fastest way to get your results is to activate your My Chart account. Instructions are located on the last page of this paperwork. If you have not heard from [place] regarding the results in 2 weeks, please contact this office.

## 2017-10-10 NOTE — Progress Notes (Signed)
PRIMARY CARE AT [facility] [address] [place] [address] [phone]  Date:  10/10/2017   Name:  Jesse King   DOB:  1966-06-07   MRN:  [id number]  PCP:  Patient, No Pcp Per    History of Present Illness:  Jesse King is a 52 y.o. male patient who presents to PCP with  Chief Complaint  Patient presents with  . Abdominal Pain    with nausea. LLQ sharp pain - recurring issue with no dx      He has a sensation that something is stabbing him intermittently of his llq for about 1.5 years.  The pain will last for a couple hours.  No diarrhea, or change in bowel movements.  He has not had his colonoscopy at this time.  No swelling or redness.  No fever.  He has noted some nausea with the abdominal pain. No dysuria, hematuria, but has some urinary frequency intermittently.   Wt Readings from Last 3 Encounters:  10/10/17 153 lb (69.4 kg)  02/15/17 147 lb 6 oz (66.8 kg)  11/05/16 148 lb (67.1 kg)     Patient Active Problem List   Diagnosis Date Noted  . Nicotine addiction 08/16/2016  . Tobacco use disorder 08/16/2016    History reviewed. No pertinent past medical history.  Past Surgical History:  Procedure Laterality Date  . APPENDECTOMY    . BACK SURGERY    . KNEE SURGERY      Social History   Tobacco Use  . Smoking status: Current Every Day Smoker    Packs/day: 0.50    Years: 30.00    Pack years: 15.00    Types: Cigarettes  . Smokeless tobacco: Never Used  Substance Use Topics  . Alcohol use: No    Alcohol/week: 0.0 oz  . Drug use: No    Family History  Problem Relation Age of Onset  . Diabetes Father   . Heart disease Father     Allergies  Allergen Reactions  . Codeine Hives and Nausea And Vomiting  . Tamiflu [Oseltamivir] Hives    Medication list has been reviewed and updated.  Current Outpatient Medications on File Prior to Visit  Medication Sig Dispense Refill  . ibuprofen (ADVIL,MOTRIN) 800 MG tablet Take 1 tablet (800 mg total) by mouth  every 8 (eight) hours as needed. 21 tablet 0  . traMADol (ULTRAM) 50 MG tablet Take 1 tablet (50 mg total) by mouth every 6 (six) hours as needed. 15 tablet 0  . cetirizine (ZYRTEC) 10 MG chewable tablet Chew 1 tablet (10 mg total) by mouth daily. (Patient not taking: Reported on 11/05/2016) 30 tablet 0  . fluticasone (FLONASE) 50 MCG/ACT nasal spray Place 2 sprays into both nostrils daily. (Patient not taking: Reported on 11/05/2016) 16 g 6  . naproxen (NAPROSYN) 375 MG tablet Take 1 tablet (375 mg total) by mouth 2 (two) times daily with a meal. (Patient not taking: Reported on 02/15/2017) 20 tablet 0  . nicotine (NICODERM CQ - DOSED IN MG/24 HOURS) 14 mg/24hr patch Place 1 patch (14 mg total) onto the skin daily. Step 2. Use for 3 weeks. (Patient not taking: Reported on 11/05/2016) 28 patch 0  . nicotine (NICODERM CQ - DOSED IN MG/24 HOURS) 21 mg/24hr patch Place 1 patch (21 mg total) onto the skin daily. For 6 weeks (Patient not taking: Reported on 11/05/2016) 56 patch 0  . nicotine (NICODERM CQ - DOSED IN MG/24 HR) 7 mg/24hr patch Place 1 patch (7  mg total) onto the skin daily. Step 3. For 1-2 weeks (Patient not taking: Reported on 11/05/2016) 28 patch 0   No current facility-administered medications on file prior to visit.     ROS ROS otherwise unremarkable unless listed above.  Physical Examination: BP 118/60   Pulse 87   Temp 98.8 F (37.1 C) (Oral)   Resp 16   Ht '5\' 8"'$  (1.727 m)   Wt 153 lb (69.4 kg)   SpO2 98%   BMI 23.26 kg/m  Ideal Body Weight: Weight in (lb) to have BMI = 25: 164.1  Physical Exam  Constitutional: He is oriented to person, place, and time. He appears well-developed and well-nourished. No distress.  HENT:  Head: Normocephalic and atraumatic.  Eyes: Conjunctivae and EOM are normal. Pupils are equal, round, and reactive to light.  Cardiovascular: Normal rate.  Pulmonary/Chest: Effort normal. No respiratory distress.  Neurological: He is alert and oriented to  person, place, and time.  Skin: Skin is warm and dry. He is not diaphoretic.  Psychiatric: He has a normal mood and affect. His behavior is normal.    Dg Abd 1 View  Result Date: 10/10/2017 CLINICAL DATA:  LEFT lower quadrant pain for 1.5 years.  Tenderness. EXAM: ABDOMEN - 1 VIEW COMPARISON:  None. FINDINGS: The bowel gas pattern is normal. No radio-opaque calculi or other significant radiographic abnormality are seen. IMPRESSION: Negative. Electronically Signed   By: Staci Righter M.D.   On: 10/10/2017 17:23   Results for orders placed or performed in visit on 10/10/17  CBC  Result Value Ref Range   WBC 8.6 3.4 - 10.8 x10E3/uL   RBC 4.70 4.14 - 5.80 x10E6/uL   Hemoglobin 14.9 13.0 - 17.7 g/dL   Hematocrit 44.2 37.5 - 51.0 %   MCV 94 79 - 97 fL   MCH 31.7 26.6 - 33.0 pg   MCHC 33.7 31.5 - 35.7 g/dL   RDW 13.5 12.3 - 15.4 %   Platelets 314 150 - 379 x10E3/uL  POCT urinalysis dipstick  Result Value Ref Range   Color, UA yellow yellow   Clarity, UA clear clear   Glucose, UA negative negative mg/dL   Bilirubin, UA negative negative   Ketones, POC UA negative negative mg/dL   Spec Grav, UA 1.025 1.010 - 1.025   Blood, UA trace-intact (A) negative   pH, UA 6.5 5.0 - 8.0   Protein Ur, POC negative negative mg/dL   Urobilinogen, UA 0.2 0.2 or 1.0 E.U./dL   Nitrite, UA Negative Negative   Leukocytes, UA Negative Negative  POCT Microscopic Urinalysis ([facility])  Result Value Ref Range   WBC,UR,HPF,POC None None WBC/hpf   RBC,UR,HPF,POC Few (A) None RBC/hpf   Bacteria None None, Too numerous to count   Mucus Absent Absent   Epithelial Cells, UR Per Microscopy Few (A) None, Too numerous to count cells/hpf     Assessment and Plan: Jesse King is a 52 y.o. male who is here today for cc of  Chief Complaint  Patient presents with  . Abdominal Pain    with nausea. LLQ sharp pain - recurring issue with no dx   advised to to take miralax at this time.  Advised no longer than 1 week.   Also advised ppi.  He is overdue a colonoscopy and this abdominal pain has been chronic.  Referring to gastroenterologist for consult.   LLQ abdominal pain - Plan: POCT urinalysis dipstick, DG Abd 1 View, CBC, POCT Microscopic Urinalysis ([facility]), Ambulatory referral  to Gastroenterology, omeprazole (PRILOSEC) 20 MG capsule, polyethylene glycol powder (GLYCOLAX/MIRALAX) powder, CANCELED: CMP14+EGFR  Ivar Drape, PA-C [facility] 1/29/201911:23 AM

## 2017-10-11 ENCOUNTER — Encounter: Payer: Self-pay | Admitting: Radiology

## 2017-10-11 ENCOUNTER — Encounter: Payer: Self-pay | Admitting: Physician Assistant

## 2017-10-11 LAB — CBC
HEMATOCRIT: 44.2 % (ref 37.5–51.0)
Hemoglobin: 14.9 g/dL (ref 13.0–17.7)
MCH: 31.7 pg (ref 26.6–33.0)
MCHC: 33.7 g/dL (ref 31.5–35.7)
MCV: 94 fL (ref 79–97)
PLATELETS: 314 10*3/uL (ref 150–379)
RBC: 4.7 x10E6/uL (ref 4.14–5.80)
RDW: 13.5 % (ref 12.3–15.4)
WBC: 8.6 10*3/uL (ref 3.4–10.8)

## 2017-10-17 ENCOUNTER — Telehealth: Payer: Self-pay | Admitting: Physician Assistant

## 2017-10-17 NOTE — Telephone Encounter (Signed)
Copied from [place] [id number]. Topic: Quick Communication - See Telephone Encounter >> Oct 17, 2017  8:54 AM Cleaster Corin, NT wrote: CRM for notification. See Telephone encounter for:   10/17/17. Pt. Calling back because he hasn't received a call yet from the gastrologist pt. Was told by sheila to call if he hasn't heard anything within a week pt. Can be reached at  [phone] pt. Stated that its ok to leave a vm due to him being at work.

## 2017-10-18 ENCOUNTER — Encounter: Payer: Self-pay | Admitting: Gastroenterology

## 2017-10-18 NOTE — Telephone Encounter (Signed)
Has appointment been made

## 2017-11-23 NOTE — Progress Notes (Signed)
[facility] Consult Note:  History: Jesse King 11/24/2017  Referring physician: Ivar Drape, PA  Reason for consult/chief complaint: Abdominal Pain (LLQ  - has had pain off and on for one year.  Pt states no other complaints.)   Subjective  HPI:  This is a 52 year old man referred by primary care noted above for about a year and a half of intermittent sharp stabbing left lower quadrant pain.  He was seen in the ED in February 2018 and June 2018, at which time he underwent CT scan of the abdomen and pelvis that was unremarkable.  Went to ED on 09/19/17 but left without being seen after long wait. Was given PPI at recent visit, and he thinks perhaps it is made the symptoms better.  It seems somewhat difficult to say since the symptoms have been intermittent and does not now occurred for about 2 months. When it occurs, this left lower quadrant pain is a stabbing or tightness sensation that is nonradiating with no change in bowel habits or rectal bleeding.  It may have associated nausea due to the degree of pain, no vomiting dysphagia or weight loss.  He has never had a previous colonoscopy.  He also reports having "stomach problems my whole life", though no specifics given. Sometimes he will feel nauseated if he sits up too quickly. ROS:  Review of Systems  Constitutional: Negative for appetite change and unexpected weight change.  HENT: Negative for mouth sores and voice change.   Eyes: Negative for pain and redness.  Respiratory: Negative for cough and shortness of breath.   Cardiovascular: Negative for chest pain and palpitations.  Genitourinary: Negative for dysuria and hematuria.  Musculoskeletal: Positive for arthralgias and back pain. Negative for myalgias.       Left heel pain that shoots up his calf.  He plans to address this with his orthopedist  Skin: Negative for pallor and rash.  Neurological: Negative for weakness and headaches.    Hematological: Negative for adenopathy.     Past Medical History: Past Medical History:  Diagnosis Date  . Fatty liver    Per CT scan      Past Surgical History: Past Surgical History:  Procedure Laterality Date  . APPENDECTOMY    . BACK SURGERY    . KNEE SURGERY       Family History: Family History  Problem Relation Age of Onset  . Diabetes Father   . Heart disease Father   . Breast cancer Maternal Grandmother   . Lung cancer Maternal Grandmother   . Colon cancer Neg Hx   . Liver cancer Neg Hx     Social History: Social History   Socioeconomic History  . Marital status: Divorced    Spouse name: None  . Number of children: 0  . Years of education: None  . Highest education level: None  Social Needs  . Financial resource strain: None  . Food insecurity - worry: None  . Food insecurity - inability: None  . Transportation needs - medical: None  . Transportation needs - non-medical: None  Occupational History  . None  Tobacco Use  . Smoking status: Current Every Day Smoker    Packs/day: 0.50    Years: 30.00    Pack years: 15.00    Types: Cigarettes  . Smokeless tobacco: Never Used  Substance and Sexual Activity  . Alcohol use: No    Alcohol/week: 0.0 oz    Comment: quit drinking 3 years ago  .  Drug use: No  . Sexual activity: None  Other Topics Concern  . None  Social History Narrative  . 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 Codeine Hives and Nausea And Vomiting  . Tamiflu [Oseltamivir] Hives    Outpatient Meds: Current Outpatient Medications  Medication Sig Dispense Refill  . ibuprofen (ADVIL,MOTRIN) 800 MG tablet Take 1 tablet (800 mg total) by mouth every 8 (eight) hours as needed. 21 tablet 0  . naproxen (NAPROSYN) 375 MG tablet Take 1 tablet (375 mg total) by mouth 2 (two) times daily with a meal. 20 tablet 0  . omeprazole (PRILOSEC) 20 MG capsule Take 1 capsule (20 mg total) by mouth  daily. 30 capsule 3  . polyethylene glycol powder (GLYCOLAX/MIRALAX) powder Take 17 g by mouth 2 (two) times daily as needed. 289 g 1  . traMADol (ULTRAM) 50 MG tablet Take 1 tablet (50 mg total) by mouth every 6 (six) hours as needed. 15 tablet 0  . Na Sulfate-K Sulfate-Mg Sulf 17.5-3.13-1.6 GM/177ML SOLN Take 1 kit by mouth once for 1 dose. 354 mL 0   No current facility-administered medications for this visit.       ___________________________________________________________________ Objective   Exam:  BP 116/60   Pulse 100   Ht '5\' 8"'$  (1.727 m)   Wt 151 lb 4 oz (68.6 kg)   BMI 23.00 kg/m    General: this is a(n) well-appearing man  Eyes: sclera anicteric, no redness  ENT: oral mucosa moist without lesions, no cervical or supraclavicular lymphadenopathy, good dentition  CV: RRR without murmur, S1/S2, no JVD, no peripheral edema  Resp: clear to auscultation bilaterally, normal RR and effort noted  GI: soft, mild left-sided tenderness, with active bowel sounds. No guarding or palpable organomegaly noted.  Skin; warm and dry, no rash or jaundice noted  Neuro: awake, alert and oriented x 3. Normal gross motor function and fluent speech  Labs:  CBC Latest Ref Rng & Units 10/10/2017 09/19/2017 02/15/2017  WBC 3.4 - 10.8 x10E3/uL 8.6 10.4 7.5  Hemoglobin 13.0 - 17.7 g/dL 14.9 14.6 14.3  Hematocrit 37.5 - 51.0 % 44.2 43.7 41.8  Platelets 150 - 379 x10E3/uL 314 301 272   CMP Latest Ref Rng & Units 09/19/2017 02/15/2017 11/05/2016  Glucose 65 - 99 mg/dL 99 95 94  BUN 6 - 20 mg/dL '15 12 11  '$ Creatinine 0.61 - 1.24 mg/dL 0.85 0.85 0.76  Sodium 135 - 145 mmol/L 140 139 140  Potassium 3.5 - 5.1 mmol/L 4.1 3.8 3.8  Chloride 101 - 111 mmol/L 105 105 108  CO2 22 - 32 mmol/L '28 23 26  '$ Calcium 8.9 - 10.3 mg/dL 9.5 9.2 9.3  Total Protein 6.5 - 8.1 g/dL 7.3 7.2 7.3  Total Bilirubin 0.3 - 1.2 mg/dL 1.2 0.9 0.8  Alkaline Phos 38 - 126 U/L 76 73 60  AST 15 - 41 U/L 14(L) 16 14(L)  ALT 17 -  63 U/L 15(L) 14(L) 13(L)    Radiologic Studies:  CT abdomen and pelvis report as noted above.  Images personally reviewed.  Assessment: Encounter Diagnosis  Name Primary?  Marland Kitchen LLQ abdominal pain Yes    Symptoms are somewhat difficult to characterize, it sounds most likely to be functional/intestinal spasm.  He does not a change in bowel habits or rectal bleeding to suggest obstructive cause. Nevertheless, it is been going on for over a year and brought him to the ED on at least 3  occasions. Plan:  Colonoscopy to rule out mucosal or neoplastic cause of this pain.  The benefits and risks of the planned procedure were described in detail with the patient or (when appropriate) their health care proxy.  Risks were outlined as including, but not limited to, bleeding, infection, perforation, adverse medication reaction leading to cardiac or pulmonary decompensation, or pancreatitis (if ERCP).  The limitation of incomplete mucosal visualization was also discussed.  No guarantees or warranties were given.  If negative, offer a trial of hyoscyamine.  He did report that he does not generally like to take medicines.  Thank you for the courtesy of this consult.  Please call me with any questions or concerns.  Nelida Meuse III  CC: Ivar Drape, [place]

## 2017-11-24 ENCOUNTER — Encounter: Payer: Self-pay | Admitting: Gastroenterology

## 2017-11-24 ENCOUNTER — Ambulatory Visit (INDEPENDENT_AMBULATORY_CARE_PROVIDER_SITE_OTHER): Payer: BLUE CROSS/BLUE SHIELD | Admitting: Gastroenterology

## 2017-11-24 ENCOUNTER — Encounter (INDEPENDENT_AMBULATORY_CARE_PROVIDER_SITE_OTHER): Payer: Self-pay

## 2017-11-24 ENCOUNTER — Telehealth: Payer: Self-pay | Admitting: Gastroenterology

## 2017-11-24 VITALS — BP 116/60 | HR 100 | Ht 68.0 in | Wt 151.2 lb

## 2017-11-24 DIAGNOSIS — R1032 Left lower quadrant pain: Secondary | ICD-10-CM

## 2017-11-24 MED ORDER — PEG-KCL-NACL-NASULF-NA ASC-C 140 G PO SOLR: 140.0000 g | ORAL | 0 refills | Status: DC

## 2017-11-24 MED ORDER — NA SULFATE-K SULFATE-MG SULF 17.5-3.13-1.6 GM/177ML PO SOLN: 1.0000 | Freq: Once | ORAL | 0 refills | Status: AC

## 2017-11-24 NOTE — Patient Instructions (Signed)
If you are age 53 or older, your body mass index should be between 23-30. Your Body mass index is 23 kg/m. If this is out of the aforementioned range listed, please consider follow up with your Primary Care Provider.  If you are age 39 or younger, your body mass index should be between 19-25. Your Body mass index is 23 kg/m. If this is out of the aformentioned range listed, please consider follow up with your Primary Care Provider.   You have been scheduled for a colonoscopy. Please follow written instructions given to you at your visit today.  Please pick up your prep supplies at the pharmacy within the next 1-3 days. If you use inhalers (even only as needed), please bring them with you on the day of your procedure. Your physician has requested that you go to www.startemmi.com and enter the access code given to you at your visit today. This web site gives a general overview about your procedure. However, you should still follow specific instructions given to you by our office regarding your preparation for the procedure.  Thank you for choosing [facility]  Dr Wilfrid Lund III

## 2017-11-25 NOTE — Telephone Encounter (Signed)
New Rx has been sent to [facility] for [place]. New instructions have been mailed to pts home address. Called and informed Jesse King.

## 2017-11-29 ENCOUNTER — Emergency Department
Admission: EM | Admit: 2017-11-29 | Discharge: 2017-11-29 | Disposition: A | Discharge status: Home / Self Care | Payer: BLUE CROSS/BLUE SHIELD | Attending: Emergency Medicine | Admitting: Emergency Medicine

## 2017-11-29 ENCOUNTER — Encounter: Payer: Self-pay | Admitting: Emergency Medicine

## 2017-11-29 ENCOUNTER — Other Ambulatory Visit: Payer: Self-pay

## 2017-11-29 DIAGNOSIS — F17210 Nicotine dependence, cigarettes, uncomplicated: Secondary | ICD-10-CM | POA: Diagnosis not present

## 2017-11-29 DIAGNOSIS — M549 Dorsalgia, unspecified: Secondary | ICD-10-CM | POA: Diagnosis not present

## 2017-11-29 DIAGNOSIS — Z79899 Other long term (current) drug therapy: Secondary | ICD-10-CM | POA: Diagnosis not present

## 2017-11-29 DIAGNOSIS — M5442 Lumbago with sciatica, left side: Secondary | ICD-10-CM | POA: Insufficient documentation

## 2017-11-29 MED ORDER — DEXAMETHASONE SODIUM PHOSPHATE 10 MG/ML IJ SOLN
10.0000 mg | Freq: Once | INTRAMUSCULAR | Status: AC
  Administered 2017-11-29: 10 mg via INTRAMUSCULAR
  Filled 2017-11-29: qty 1

## 2017-11-29 MED ORDER — CYCLOBENZAPRINE HCL 5 MG PO TABS: 5.0000 mg | ORAL_TABLET | Freq: Every evening | ORAL | 0 refills | Status: DC | PRN

## 2017-11-29 MED ORDER — PREDNISONE 10 MG PO TABS: ORAL_TABLET | ORAL | 0 refills | Status: DC

## 2017-11-29 NOTE — ED Triage Notes (Signed)
Pt reports that his lower back is hurting. Reports that it started last evening. No new injury. He states that he has been able to sleep. He is using crutches because of the pain to get around. He thinks that it may be his sciatic nerve. Pain shoots down his left leg.

## 2017-11-29 NOTE — Discharge Instructions (Signed)
Follow-up with Dr. Posey Pronto who is the orthopedist on call for [facility] if any continued problems with your back.  Begin taking prednisone as directed beginning with 6 tablets today and tapering down by 1 tablet.  Flexeril at bedtime as needed for muscle spasms.

## 2017-11-29 NOTE — ED Notes (Signed)
Pt reports sharp shooting pain in his left buttock that shoots down his left leg. Pt reports pain starting around 8pm last night, applied a heating pad that made it worse. Pt states it took him 30 mins to get out of bed this morning and had to "crawl into the living room this morning." Pt reports being unable to put any pressure on it. Using crutches to ambulate so he does not have to bear weight on left leg.

## 2017-11-29 NOTE — ED Notes (Signed)
Pt ambulatory with crutches upon discharge. States "I can actually put weight on my foot now." Verbalized understanding of discharge instructions, follow-up care and prescriptions. VSS. Skin warm and dry. A&O x4.

## 2017-11-29 NOTE — ED Provider Notes (Signed)
[facility] Emergency Department Provider Note  ____________________________________________   First MD Initiated Contact with Patient 11/29/17 1157     (approximate)  I have reviewed the triage vital signs and the nursing notes.   HISTORY  Chief Complaint Back Pain   HPI Jesse King is a 52 y.o. male is here with complaint of back pain that radiates down his left leg.  Patient states he has had sciatica before but it is been many years ago.  He denies any recent injury.  He denies any urinary symptoms.  He has been taking over-the-counter medication without any relief and also used a topical cream which did nothing.  Patient currently is using crutches to help him walk due to the pain.  He rates his pain as a 10/10.  Past Medical History:  Diagnosis Date  . Fatty liver    Per CT scan     Patient Active Problem List   Diagnosis Date Noted  . Nicotine addiction 08/16/2016  . Tobacco use disorder 08/16/2016    Past Surgical History:  Procedure Laterality Date  . APPENDECTOMY    . BACK SURGERY    . KNEE SURGERY      Prior to Admission medications   Medication Sig Start Date End Date Taking? Authorizing Provider  cyclobenzaprine (FLEXERIL) 5 MG tablet Take 1 tablet (5 mg total) by mouth at bedtime as needed for muscle spasms. 11/29/17   Johnn Hai, PA-C  ibuprofen (ADVIL,MOTRIN) 800 MG tablet Take 1 tablet (800 mg total) by mouth every 8 (eight) hours as needed. 02/15/17   Long, [name], MD  naproxen (NAPROSYN) 375 MG tablet Take 1 tablet (375 mg total) by mouth 2 (two) times daily with a meal. 11/05/16   Blanchie Dessert, MD  omeprazole (PRILOSEC) 20 MG capsule Take 1 capsule (20 mg total) by mouth daily. 10/10/17   [name], PA  polyethylene glycol powder (GLYCOLAX/MIRALAX) powder Take 17 g by mouth 2 (two) times daily as needed. 10/10/17   Joretta Bachelor, PA  predniSONE (DELTASONE) 10 MG tablet Take 6 tablets  today, on day  2 take 5 tablets, day 3 take 4 tablets, day 4 take 3 tablets, day 5 take  2 tablets and 1 tablet the last day 11/29/17   Johnn Hai, PA-C  traMADol (ULTRAM) 50 MG tablet Take 1 tablet (50 mg total) by mouth every 6 (six) hours as needed. 02/15/17   Long, [name], MD    Allergies Codeine and Tamiflu [oseltamivir]  Family History  Problem Relation Age of Onset  . Diabetes Father   . Heart disease Father   . Breast cancer Maternal Grandmother   . Lung cancer Maternal Grandmother   . Colon cancer Neg Hx   . Liver cancer Neg Hx     Social History Social History   Tobacco Use  . Smoking status: Current Every Day Smoker    Packs/day: 0.50    Years: 30.00    Pack years: 15.00    Types: Cigarettes  . Smokeless tobacco: Never Used  Substance Use Topics  . Alcohol use: No    Alcohol/week: 0.0 oz    Comment: quit drinking 3 years ago  . Drug use: No    Review of Systems Constitutional: No fever/chills Cardiovascular: Denies chest pain. Respiratory: Denies shortness of breath. Gastrointestinal: No abdominal pain.  No nausea, no vomiting.   Genitourinary: Negative for dysuria. Musculoskeletal: Positive for back pain with left leg radiculopathy. Skin:  Negative for rash. Neurological: Negative for headaches, focal weakness or numbness. ___________________________________________   PHYSICAL EXAM:  VITAL SIGNS: ED Triage Vitals  Enc Vitals Group     BP 11/29/17 1134 136/74     Pulse Rate 11/29/17 1134 (!) 113     Resp 11/29/17 1134 18     Temp 11/29/17 1134 98.7 F (37.1 C)     Temp Source 11/29/17 1134 Oral     SpO2 11/29/17 1134 98 %     Weight 11/29/17 1135 151 lb (68.5 kg)     Height 11/29/17 1135 5\' 8"  (1.727 m)     Head Circumference --      Peak Flow --      Pain Score 11/29/17 1135 10     Pain Loc --      Pain Edu? --      Excl. in [place]? --     Constitutional: Alert and oriented. Well appearing and in no acute distress. Eyes: Conjunctivae are normal.    Head: Atraumatic. Nose: No congestion/rhinnorhea. Neck: No stridor.   Cardiovascular: Normal rate, regular rhythm. Grossly normal heart sounds.  Good peripheral circulation. Respiratory: Normal respiratory effort.  No retractions. Lungs CTAB. Gastrointestinal: Soft and nontender. No distention.  No CVA tenderness. Musculoskeletal: On examination of the back there is no gross deformity however there is some mild tenderness on palpation of the sacral area and left SI joint and surrounding tissue.  Range of motion is restricted secondary to discomfort.  Patient is able to stand without assistance.  Limping is noted secondary to his left leg pain.  Good muscle strength.  Straight leg raises are positive at approximately 60 degrees. Neurologic:  Normal speech and language. No gross focal neurologic deficits are appreciated.  Reflexes are equal at 2+ bilaterally. Skin:  Skin is warm, dry and intact. No rash noted. Psychiatric: Mood and affect are normal. Speech and behavior are normal.  ____________________________________________   LABS (all labs ordered are listed, but only abnormal results are displayed)  Labs Reviewed - No data to display    PROCEDURES  Procedure(s) performed: None  Procedures  Critical Care performed: No  ____________________________________________   INITIAL IMPRESSION / ASSESSMENT AND PLAN / ED COURSE Patient is here with complaint of low back pain with left leg radiculopathy.  Patient has experienced sciatica before and this feels exactly the same as it did in the past.  Patient was given Decadron 10 mg IM in the department.  He was discharged with a prescription for a tapering dose of prednisone starting at 60 mg and Flexeril 5 mg to be taken at bedtime.  He is to follow-up with orthopedist at [facility] if any continued problems with his back he may also follow-up with [facility] acute care since he does not have a  PCP.   ____________________________________________   FINAL CLINICAL IMPRESSION(S) / ED DIAGNOSES  Final diagnoses:  Acute left-sided low back pain with left-sided sciatica     ED Discharge Orders        Ordered    predniSONE (DELTASONE) 10 MG tablet     11/29/17 1228    cyclobenzaprine (FLEXERIL) 5 MG tablet  At bedtime PRN     11/29/17 1228       Note:  This document was prepared using Dragon voice recognition software and may include unintentional dictation errors.    Johnn Hai, PA-C 11/29/17 1236    Schuyler Amor, MD 11/29/17 [id number]

## 2017-12-05 DIAGNOSIS — Q72812 Congenital shortening of left lower limb: Secondary | ICD-10-CM | POA: Diagnosis not present

## 2017-12-05 DIAGNOSIS — M5032 Other cervical disc degeneration, mid-cervical region, unspecified level: Secondary | ICD-10-CM | POA: Diagnosis not present

## 2017-12-05 DIAGNOSIS — M9905 Segmental and somatic dysfunction of pelvic region: Secondary | ICD-10-CM | POA: Diagnosis not present

## 2017-12-05 DIAGNOSIS — M9901 Segmental and somatic dysfunction of cervical region: Secondary | ICD-10-CM | POA: Diagnosis not present

## 2017-12-07 ENCOUNTER — Ambulatory Visit (AMBULATORY_SURGERY_CENTER): Payer: BLUE CROSS/BLUE SHIELD | Admitting: Gastroenterology

## 2017-12-07 ENCOUNTER — Encounter: Payer: Self-pay | Admitting: Gastroenterology

## 2017-12-07 ENCOUNTER — Other Ambulatory Visit: Payer: Self-pay

## 2017-12-07 VITALS — BP 106/69 | HR 80 | Temp 99.5°F | Resp 19 | Ht 68.0 in | Wt 151.0 lb

## 2017-12-07 DIAGNOSIS — D125 Benign neoplasm of sigmoid colon: Secondary | ICD-10-CM

## 2017-12-07 DIAGNOSIS — D122 Benign neoplasm of ascending colon: Secondary | ICD-10-CM | POA: Diagnosis not present

## 2017-12-07 DIAGNOSIS — R1032 Left lower quadrant pain: Secondary | ICD-10-CM

## 2017-12-07 DIAGNOSIS — K635 Polyp of colon: Secondary | ICD-10-CM

## 2017-12-07 MED ORDER — SODIUM CHLORIDE 0.9 % IV SOLN: 500.0000 mL | Freq: Once | INTRAVENOUS | Status: AC

## 2017-12-07 NOTE — Patient Instructions (Signed)
Handout given on polyps  YOU HAD AN ENDOSCOPIC PROCEDURE TODAY AT THE [facility]:   Refer to the procedure report that was given to you for any specific questions about what was found during the examination.  If the procedure report does not answer your questions, please call your gastroenterologist to clarify.  If you requested that your care partner not be given the details of your procedure findings, then the procedure report has been included in a sealed envelope for you to review at your convenience later.  YOU SHOULD EXPECT: Some feelings of bloating in the abdomen. Passage of more gas than usual.  Walking can help get rid of the air that was put into your GI tract during the procedure and reduce the bloating. If you had a lower endoscopy (such as a colonoscopy or flexible sigmoidoscopy) you may notice spotting of blood in your stool or on the toilet paper. If you underwent a bowel prep for your procedure, you may not have a normal bowel movement for a few days.  Please Note:  You might notice some irritation and congestion in your nose or some drainage.  This is from the oxygen used during your procedure.  There is no need for concern and it should clear up in a day or so.  SYMPTOMS TO REPORT IMMEDIATELY:   Following lower endoscopy (colonoscopy or flexible sigmoidoscopy):  Excessive amounts of blood in the stool  Significant tenderness or worsening of abdominal pains  Swelling of the abdomen that is new, acute  Fever of 100F or higher    For urgent or emergent issues, a gastroenterologist can be reached at any hour by calling [phone].   DIET:  We do recommend a small meal at first, but then you may proceed to your regular diet.  Drink plenty of fluids but you should avoid alcoholic beverages for 24 hours.  ACTIVITY:  You should plan to take it easy for the rest of today and you should NOT DRIVE or use heavy machinery until tomorrow (because of the sedation  medicines used during the test).    FOLLOW UP: Our staff will call the number listed on your records the next business day following your procedure to check on you and address any questions or concerns that you may have regarding the information given to you following your procedure. If we do not reach you, we will leave a message.  However, if you are feeling well and you are not experiencing any problems, there is no need to return our call.  We will assume that you have returned to your regular daily activities without incident.  If any biopsies were taken you will be contacted by phone or by letter within the next 1-3 weeks.  Please call us at [phone] if you have not heard about the biopsies in 3 weeks.    SIGNATURES/CONFIDENTIALITY: You and/or your care partner have signed paperwork which will be entered into your electronic medical record.  These signatures attest to the fact that that the information above on your After Visit Summary has been reviewed and is understood.  Full responsibility of the confidentiality of this discharge information lies with you and/or your care-partner. 

## 2017-12-07 NOTE — Progress Notes (Signed)
Called to room to assist during endoscopic procedure.  Patient ID and intended procedure confirmed with present staff. Received instructions for my participation in the procedure from the performing physician.  

## 2017-12-07 NOTE — Op Note (Signed)
[facility] Patient Name: Jesse King Procedure Date: 12/07/2017 9:17 AM MRN: [id number] Endoscopist: Mallie Mussel L. Loletha Carrow , MD Age: 52 Referring MD:  Date of Birth: 09-Sep-1966 Gender: Male Account #: [id number] Procedure:                Colonoscopy Indications:              Abdominal pain in the left lower quadrant Medicines:                Monitored Anesthesia Care Procedure:                Pre-Anesthesia Assessment:                           - Prior to the procedure, a History and Physical                            was performed, and patient medications and                            allergies were reviewed. The patient's tolerance of                            previous anesthesia was also reviewed. The risks                            and benefits of the procedure and the sedation                            options and risks were discussed with the patient.                            All questions were answered, and informed consent                            was obtained. Prior Anticoagulants: The patient has                            taken no previous anticoagulant or antiplatelet                            agents. ASA Grade Assessment: II - A patient with                            mild systemic disease. After reviewing the risks                            and benefits, the patient was deemed in                            satisfactory condition to undergo the procedure.                           After obtaining informed consent, the colonoscope  was passed under direct vision. Throughout the                            procedure, the patient's blood pressure, pulse, and                            oxygen saturations were monitored continuously. The                            Colonoscope was introduced through the anus and                            advanced to the the terminal ileum. The colonoscopy                            was performed without  difficulty. The patient                            tolerated the procedure well. The quality of the                            bowel preparation was good. The ileocecal valve,                            appendiceal orifice, and rectum were photographed.                            The quality of the bowel preparation was evaluated                            using the BBPS [facility] Bowel Preparation Scale)                            with scores of: Right Colon = 2, Transverse Colon =                            2 and Left Colon = 2. The total BBPS score equals 6. Scope In: 9:25:05 AM Scope Out: 9:43:25 AM Scope Withdrawal Time: 0 hours 14 minutes 55 seconds  Total Procedure Duration: 0 hours 18 minutes 20 seconds  Findings:                 The perianal and digital rectal examinations were                            normal.                           Four sessile polyps were found in the sigmoid colon                            and proximal ascending colon. The polyps were 4 to  8 mm in size. These polyps were removed with a cold                            snare. Resection and retrieval were complete.                           Internal hemorrhoids were found. The hemorrhoids                            were medium-sized and Grade II (internal                            hemorrhoids that prolapse but reduce spontaneously).                           The exam was otherwise without abnormality on                            direct and retroflexion views. Complications:            No immediate complications. Estimated Blood Loss:     Estimated blood loss was minimal. Impression:               - Four 4 to 8 mm polyps in the sigmoid colon and in                            the proximal ascending colon, removed with a cold                            snare. Resected and retrieved.                           - Internal hemorrhoids.                           - The examination was  otherwise normal on direct                            and retroflexion views.                           Pain sounds like intermittent intestinal spasm. If                            recurs, patient may consider trial of                            anti-spadmodic medication. Recommendation:           - Patient has a contact number available for                            emergencies. The signs and symptoms of potential                            delayed complications were discussed  with the                            patient. Return to normal activities tomorrow.                            Written discharge instructions were provided to the                            patient.                           - Resume previous diet.                           - Continue present medications.                           - Await pathology results.                           - Repeat colonoscopy is recommended for                            surveillance. The colonoscopy date will be                            determined after pathology results from today's                            exam become available for review. Carolle Ishii L. Loletha Carrow, MD 12/07/2017 9:55:34 AM This report has been signed electronically.

## 2017-12-07 NOTE — Progress Notes (Signed)
Report given to PACU, vss 

## 2017-12-08 ENCOUNTER — Telehealth: Payer: Self-pay

## 2017-12-08 NOTE — Telephone Encounter (Signed)
  Follow up Call-  Call back number 12/07/2017  Post procedure Call Back phone  # [phone] cell  Permission to leave phone message Yes  Some recent data might be hidden     Patient questions:  Do you have a fever, pain , or abdominal swelling? No. Pain Score  0 *  Have you tolerated food without any problems? Yes.    Have you been able to return to your normal activities? Yes.    Do you have any questions about your discharge instructions: Diet   No. Medications  No. Follow up visit  No.  Do you have questions or concerns about your Care? No.  Actions: * If pain score is 4 or above: No action needed, pain <4.

## 2017-12-08 NOTE — Telephone Encounter (Signed)
Left message

## 2017-12-12 ENCOUNTER — Encounter: Payer: Self-pay | Admitting: Gastroenterology

## 2017-12-12 ENCOUNTER — Encounter: Payer: Self-pay | Admitting: Physician Assistant

## 2017-12-15 ENCOUNTER — Encounter: Payer: Self-pay | Admitting: Physician Assistant

## 2017-12-15 ENCOUNTER — Other Ambulatory Visit: Payer: Self-pay

## 2017-12-15 ENCOUNTER — Ambulatory Visit: Payer: BLUE CROSS/BLUE SHIELD | Admitting: Physician Assistant

## 2017-12-15 VITALS — BP 116/60 | HR 104 | Temp 98.4°F | Ht 68.0 in | Wt 140.6 lb

## 2017-12-15 DIAGNOSIS — R42 Dizziness and giddiness: Secondary | ICD-10-CM

## 2017-12-15 DIAGNOSIS — M79674 Pain in right toe(s): Secondary | ICD-10-CM | POA: Diagnosis not present

## 2017-12-15 DIAGNOSIS — R0989 Other specified symptoms and signs involving the circulatory and respiratory systems: Secondary | ICD-10-CM | POA: Diagnosis not present

## 2017-12-15 DIAGNOSIS — R55 Syncope and collapse: Secondary | ICD-10-CM | POA: Diagnosis not present

## 2017-12-15 LAB — POCT CBC
GRANULOCYTE PERCENT: 66.8 % (ref 37–80)
HEMATOCRIT: 39.6 % — AB (ref 43.5–53.7)
Hemoglobin: 12.8 g/dL — AB (ref 14.1–18.1)
Lymph, poc: 2.7 (ref 0.6–3.4)
MCH, POC: 30.8 pg (ref 27–31.2)
MCHC: 32.4 g/dL (ref 31.8–35.4)
MCV: 95 fL (ref 80–97)
MID (CBC): 0.7 (ref 0–0.9)
MPV: 7.7 fL (ref 0–99.8)
POC Granulocyte: 6.9 (ref 2–6.9)
POC LYMPH %: 26.3 % (ref 10–50)
POC MID %: 6.9 %M (ref 0–12)
Platelet Count, POC: 486 10*3/uL — AB (ref 142–424)
RBC: 4.17 M/uL — AB (ref 4.69–6.13)
RDW, POC: 13 %
WBC: 10.4 10*3/uL — AB (ref 4.6–10.2)

## 2017-12-15 LAB — GLUCOSE, POCT (MANUAL RESULT ENTRY): POC Glucose: 96 mg/dl (ref 70–99)

## 2017-12-15 NOTE — Progress Notes (Signed)
PRIMARY CARE AT [facility] [address], [place] [address] [phone]  Date:  12/15/2017   Name:  YUJI King   DOB:  1966-07-17   MRN:  [id number]  PCP:  Patient, No Pcp Per    History of Present Illness:  Jesse King is a 52 y.o. male patient who presents to PCP with  Chief Complaint  Patient presents with  . Night Sweats    this morning and dizzy. Blurry for a second. only happened one other time in his life  . toe bump    feels like a ball behind the second toe on the right foot (appered last night)     He was walkin, and had sudden diaphoresis and lightheaded syncope.  Blurriness which lasted for seconds.  The diaphoesis and lightheaded for 5 minutes.  He held onto the wall, then the sensation ended.  During symptoms, there was a tinnitus "whooshing" noise to the right ear.  He was able to go to work.  This never reoccurred.  This was not a sudden lift.  He has neck pain for 1 week.   No congestion, sore thraot, or coughing.  No blood or black stool. No fever.   No hematuria, dysuria, or frequency.   Right toe pain in the last week without trauma.  Patient Active Problem List   Diagnosis Date Noted  . Nicotine addiction 08/16/2016  . Tobacco use disorder 08/16/2016    Past Medical History:  Diagnosis Date  . Chronic back pain    muscle spasms  . Fatty liver    Per CT scan   . GERD (gastroesophageal reflux disease)     Past Surgical History:  Procedure Laterality Date  . APPENDECTOMY    . BACK SURGERY    . KNEE SURGERY    . WISDOM TOOTH EXTRACTION      Social History   Tobacco Use  . Smoking status: Current Every Day Smoker    Packs/day: 0.50    Years: 30.00    Pack years: 15.00    Types: Cigarettes  . Smokeless tobacco: Never Used  . Tobacco comment: "not quite a pack per day"  Substance Use Topics  . Alcohol use: No    Alcohol/week: 0.0 oz    Comment: quit drinking 3 years ago  . Drug use: No    Family History  Problem Relation Age of  Onset  . Diabetes Father   . Heart disease Father   . Breast cancer Maternal Grandmother   . Lung cancer Maternal Grandmother   . Colon cancer Neg Hx   . Liver cancer Neg Hx   . Esophageal cancer Neg Hx   . Pancreatic cancer Neg Hx   . Prostate cancer Neg Hx   . Rectal cancer Neg Hx   . Stomach cancer Neg Hx     Allergies  Allergen Reactions  . Codeine Hives and Nausea And Vomiting  . Tamiflu [Oseltamivir] Hives    Medication list has been reviewed and updated.  Current Outpatient Medications on File Prior to Visit  Medication Sig Dispense Refill  . cyclobenzaprine (FLEXERIL) 5 MG tablet Take 1 tablet (5 mg total) by mouth at bedtime as needed for muscle spasms. 5 tablet 0  . naproxen (NAPROSYN) 375 MG tablet Take 1 tablet (375 mg total) by mouth 2 (two) times daily with a meal. 20 tablet 0  . omeprazole (PRILOSEC) 20 MG capsule Take 1 capsule (20 mg total) by mouth daily. 30 capsule 3  .  polyethylene glycol powder (GLYCOLAX/MIRALAX) powder Take 17 g by mouth 2 (two) times daily as needed. 289 g 1  . predniSONE (DELTASONE) 10 MG tablet Take 6 tablets  today, on day 2 take 5 tablets, day 3 take 4 tablets, day 4 take 3 tablets, day 5 take  2 tablets and 1 tablet the last day 21 tablet 0  . traMADol (ULTRAM) 50 MG tablet Take 1 tablet (50 mg total) by mouth every 6 (six) hours as needed. 15 tablet 0  . ibuprofen (ADVIL,MOTRIN) 800 MG tablet Take 1 tablet (800 mg total) by mouth every 8 (eight) hours as needed. (Patient not taking: Reported on 12/15/2017) 21 tablet 0   Current Facility-Administered Medications on File Prior to Visit  Medication Dose Route Frequency Provider Last Rate Last Dose  . 0.9 %  sodium chloride infusion  500 mL Intravenous Once Nelida Meuse III, MD        ROS ROS otherwise unremarkable unless listed above.  Physical Examination: BP 116/60 (BP Location: Left Arm, Patient Position: Sitting, Cuff Size: Normal)   Pulse (!) 104   Temp 98.4 F (36.9 C)  (Oral)   Ht 5\' 8"  (1.727 m)   Wt 140 lb 9.6 oz (63.8 kg)   SpO2 100%   BMI 21.38 kg/m  Ideal Body Weight: Weight in (lb) to have BMI = 25: 164.1  Physical Exam  Constitutional: He is oriented to person, place, and time. He appears well-developed and well-nourished. No distress.  HENT:  Head: Normocephalic and atraumatic.  Eyes: Pupils are equal, round, and reactive to light. Conjunctivae and EOM are normal.  Cardiovascular: Normal rate and regular rhythm.  Pulses:      Carotid pulses are 2+ on the right side.      Radial pulses are 2+ on the right side, and 2+ on the left side.  Right bruit appreciated posterior to the carotid.    Pulmonary/Chest: Effort normal. No respiratory distress.  Neurological: He is alert and oriented to person, place, and time.  Skin: Skin is warm and dry. He is not diaphoretic.  Psychiatric: He has a normal mood and affect. His behavior is normal.    Assessment and Plan: [name] is a 52 y.o. male who is here today for cc of  Chief Complaint  Patient presents with  . Night Sweats    this morning and dizzy. Blurry for a second. only happened one other time in his life  . toe bump    feels like a ball behind the second toe on the right foot (appered last night)  possible short PR revealing a tachycardic episode, possible vascular steal  Toe will watchfully wait on this.  Advised to use bandage around toe between 2nd 1st and 3rd.  And insure that he is wearing appropriate footwear. Postural dizziness with presyncope - Plan: POCT glucose (manual entry), POCT CBC, EKG 12-Lead, Ambulatory referral to Cardiology  Vascular bruit - Plan: POCT glucose (manual entry), POCT CBC, EKG 12-Lead, Ambulatory referral to Cardiology  Pain of toe of right foot  Ivar Drape, PA-C [facility] 4/10/201912:17 PM

## 2017-12-15 NOTE — Patient Instructions (Addendum)
Please await contact for cardiology at this time.   Please make sure you continue to hydrate well.   Dizziness Dizziness is a common problem. It makes you feel unsteady or light-headed. You may feel like you are about to pass out (faint). Dizziness can lead to getting hurt if you stumble or fall. Dizziness can be caused by many things, including:  Medicines.  Not having enough water in your body (dehydration).  Illness.  Follow these instructions at home: Eating and drinking  Drink enough fluid to keep your pee (urine) clear or pale yellow. This helps to keep you from getting dehydrated. Try to drink more clear fluids, such as water.  Do not drink alcohol.  Limit how much caffeine you drink or eat, if your doctor tells you to do that.  Limit how much salt (sodium) you drink or eat, if your doctor tells you to do that. Activity  Avoid making quick movements. ? When you stand up from sitting in a chair, steady yourself until you feel okay. ? In the morning, first sit up on the side of the bed. When you feel okay, stand slowly while you hold onto something. Do this until you know that your balance is fine.  If you need to stand in one place for a long time, move your legs often. Tighten and relax the muscles in your legs while you are standing.  Do not drive or use heavy machinery if you feel dizzy.  Avoid bending down if you feel dizzy. Place items in your home so you can reach them easily without leaning over. Lifestyle  Do not use any products that contain nicotine or tobacco, such as cigarettes and e-cigarettes. If you need help quitting, ask your doctor.  Try to lower your stress level. You can do this by using methods such as yoga or meditation. Talk with your doctor if you need help. General instructions  Watch your dizziness for any changes.  Take over-the-counter and prescription medicines only as told by your doctor. Talk with your doctor if you think that you are  dizzy because of a medicine that you are taking.  Tell a friend or a family member that you are feeling dizzy. If he or she notices any changes in your behavior, have this person call your doctor.  Keep all follow-up visits as told by your doctor. This is important. Contact a doctor if:  Your dizziness does not go away.  Your dizziness or light-headedness gets worse.  You feel sick to your stomach (nauseous).  You have trouble hearing.  You have new symptoms.  You are unsteady on your feet.  You feel like the room is spinning. Get help right away if:  You throw up (vomit) or have watery poop (diarrhea), and you cannot eat or drink anything.  You have trouble: ? Talking. ? Walking. ? Swallowing. ? Using your arms, hands, or legs.  You feel generally weak.  You are not thinking clearly, or you have trouble forming sentences. A friend or family member may notice this.  You have: ? Chest pain. ? Pain in your belly (abdomen). ? Shortness of breath. ? Sweating.  Your vision changes.  You are bleeding.  You have a very bad headache.  You have neck pain or a stiff neck.  You have a fever. These symptoms may be an emergency. Do not wait to see if the symptoms will go away. Get medical help right away. Call your local emergency services (911 in  the [place] Do not drive yourself to the hospital. Summary  Dizziness makes you feel unsteady or light-headed. You may feel like you are about to pass out (faint).  Drink enough fluid to keep your pee (urine) clear or pale yellow. Do not drink alcohol.  Avoid making quick movements if you feel dizzy.  Watch your dizziness for any changes. This information is not intended to replace advice given to you by your health care provider. Make sure you discuss any questions you have with your health care provider. Document Released: 08/19/2011 Document Revised: 09/16/2016 Document Reviewed: 09/16/2016 Elsevier Interactive Patient  Education  2017 [employer].   IF you received an x-ray today, you will receive an invoice from [facility]. Please contact [facility] at [phone] with questions or concerns regarding your invoice.   IF you received labwork today, you will receive an invoice from [place] [facility]. Please contact [facility] at [phone] with questions or concerns regarding your invoice.   Our billing staff will not be able to assist you with questions regarding bills from these companies.  You will be contacted with the lab results as soon as they are available. The fastest way to get your results is to activate your My Chart account. Instructions are located on the last page of this paperwork. If you have not heard from [place] regarding the results in 2 weeks, please contact this office.

## 2017-12-16 ENCOUNTER — Telehealth: Payer: Self-pay | Admitting: Physician Assistant

## 2017-12-16 DIAGNOSIS — M542 Cervicalgia: Secondary | ICD-10-CM

## 2017-12-16 NOTE — Telephone Encounter (Signed)
Spoke with pt concerning referral. Pt requesting a muscle relaxer or something for neck pain he said he mentioned at appt yesterday.

## 2017-12-16 NOTE — Telephone Encounter (Signed)
Please advise 

## 2017-12-16 NOTE — Telephone Encounter (Signed)
[facility] and they were able to see pt today at 1:30 pm, but pt was unable to make this appt due to work. They had an opening for Monday 4/8 at 3pm with arrival of 2:30pm and pt stated he could make this appt. Spoke with pt and he is aware of appt as well as location and to bring photo ID, insurance card, copay, and any bottles of medications he is currently taking.

## 2017-12-19 DIAGNOSIS — R55 Syncope and collapse: Secondary | ICD-10-CM | POA: Diagnosis not present

## 2017-12-19 DIAGNOSIS — R000 Tachycardia, unspecified: Secondary | ICD-10-CM | POA: Diagnosis not present

## 2017-12-19 DIAGNOSIS — F17200 Nicotine dependence, unspecified, uncomplicated: Secondary | ICD-10-CM | POA: Diagnosis not present

## 2017-12-19 DIAGNOSIS — R0989 Other specified symptoms and signs involving the circulatory and respiratory systems: Secondary | ICD-10-CM | POA: Diagnosis not present

## 2017-12-19 NOTE — Telephone Encounter (Signed)
Thank you :)

## 2017-12-21 MED ORDER — METHOCARBAMOL 500 MG PO TABS: 500.0000 mg | ORAL_TABLET | Freq: Four times a day (QID) | ORAL | 0 refills | Status: DC

## 2017-12-21 NOTE — Telephone Encounter (Signed)
I am sending msk relaxant at this time. The reason that he came in was for the near syncope.  Please make sure you have followed up with cardiology.

## 2017-12-21 NOTE — Telephone Encounter (Signed)
Phone call to patient. Per signed authorization to leave detailed message, left voicemail stating methocarbamol has been sent to [facility] on [address], provider hopes he was able to make it to cardiology appointment. Call back if any questions.

## 2018-01-06 ENCOUNTER — Encounter: Payer: Self-pay | Admitting: Physician Assistant

## 2018-01-06 DIAGNOSIS — R55 Syncope and collapse: Secondary | ICD-10-CM | POA: Diagnosis not present

## 2018-01-06 DIAGNOSIS — R000 Tachycardia, unspecified: Secondary | ICD-10-CM | POA: Diagnosis not present

## 2018-01-06 DIAGNOSIS — M1990 Unspecified osteoarthritis, unspecified site: Secondary | ICD-10-CM | POA: Diagnosis not present

## 2018-01-18 DIAGNOSIS — R0789 Other chest pain: Secondary | ICD-10-CM | POA: Diagnosis not present

## 2018-01-18 DIAGNOSIS — R55 Syncope and collapse: Secondary | ICD-10-CM | POA: Diagnosis not present

## 2018-01-25 DIAGNOSIS — I6521 Occlusion and stenosis of right carotid artery: Secondary | ICD-10-CM | POA: Diagnosis not present

## 2018-01-25 DIAGNOSIS — R55 Syncope and collapse: Secondary | ICD-10-CM | POA: Diagnosis not present

## 2018-01-25 DIAGNOSIS — R000 Tachycardia, unspecified: Secondary | ICD-10-CM | POA: Diagnosis not present

## 2018-01-25 DIAGNOSIS — F17200 Nicotine dependence, unspecified, uncomplicated: Secondary | ICD-10-CM | POA: Diagnosis not present

## 2018-04-18 DIAGNOSIS — Z0189 Encounter for other specified special examinations: Secondary | ICD-10-CM | POA: Diagnosis not present

## 2018-04-18 DIAGNOSIS — R55 Syncope and collapse: Secondary | ICD-10-CM | POA: Diagnosis not present

## 2018-04-26 DIAGNOSIS — R55 Syncope and collapse: Secondary | ICD-10-CM | POA: Diagnosis not present

## 2018-04-26 DIAGNOSIS — I6521 Occlusion and stenosis of right carotid artery: Secondary | ICD-10-CM | POA: Diagnosis not present

## 2018-04-26 DIAGNOSIS — F17200 Nicotine dependence, unspecified, uncomplicated: Secondary | ICD-10-CM | POA: Diagnosis not present

## 2018-04-26 DIAGNOSIS — R000 Tachycardia, unspecified: Secondary | ICD-10-CM | POA: Diagnosis not present

## 2018-06-23 DIAGNOSIS — N390 Urinary tract infection, site not specified: Secondary | ICD-10-CM | POA: Diagnosis not present

## 2018-06-23 DIAGNOSIS — R1011 Right upper quadrant pain: Secondary | ICD-10-CM | POA: Diagnosis not present

## 2018-06-27 ENCOUNTER — Ambulatory Visit: Payer: BLUE CROSS/BLUE SHIELD | Admitting: Gastroenterology

## 2018-06-28 DIAGNOSIS — Z86010 Personal history of colonic polyps: Secondary | ICD-10-CM | POA: Diagnosis not present

## 2018-06-28 DIAGNOSIS — R1011 Right upper quadrant pain: Secondary | ICD-10-CM | POA: Diagnosis not present

## 2018-06-28 DIAGNOSIS — R1032 Left lower quadrant pain: Secondary | ICD-10-CM | POA: Diagnosis not present

## 2018-06-28 DIAGNOSIS — K760 Fatty (change of) liver, not elsewhere classified: Secondary | ICD-10-CM | POA: Diagnosis not present

## 2018-08-11 IMAGING — DX DG ABDOMEN 1V
1 series · 1 of 1 positions shown · non-contrast
Comparison: None.

CLINICAL DATA: LEFT lower quadrant pain for 1.5 years.  Tenderness.

EXAM:
ABDOMEN - 1 VIEW

[abdomen kub]
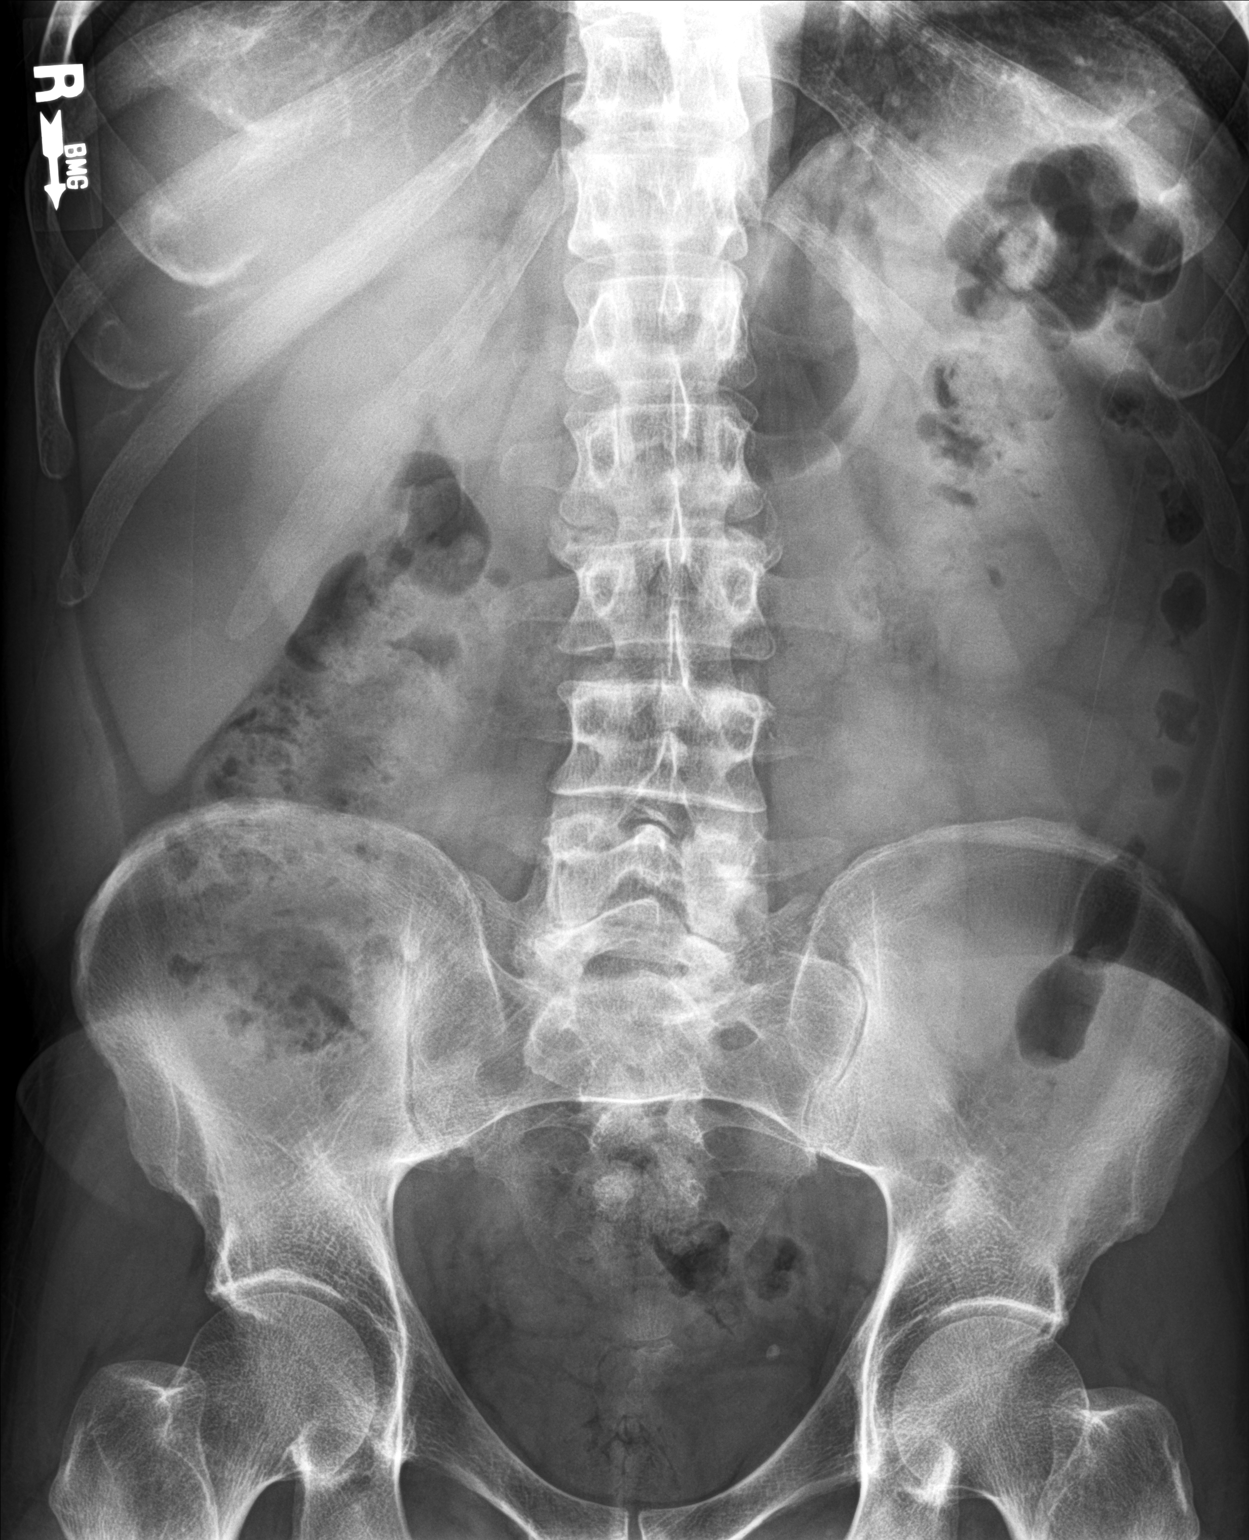

[1 of 1 positions shown; findings below may reference images not displayed]

FINDINGS: The bowel gas pattern is normal. No radio-opaque calculi or other
significant radiographic abnormality are seen.
IMPRESSION: Negative.

## 2018-11-14 ENCOUNTER — Other Ambulatory Visit: Payer: Self-pay | Admitting: Cardiology

## 2018-11-14 DIAGNOSIS — I6521 Occlusion and stenosis of right carotid artery: Secondary | ICD-10-CM

## 2019-01-23 ENCOUNTER — Other Ambulatory Visit: Payer: BLUE CROSS/BLUE SHIELD

## 2019-01-29 ENCOUNTER — Ambulatory Visit: Payer: BLUE CROSS/BLUE SHIELD | Admitting: Cardiology

## 2019-05-10 DIAGNOSIS — Z20828 Contact with and (suspected) exposure to other viral communicable diseases: Secondary | ICD-10-CM | POA: Diagnosis not present

## 2019-05-14 ENCOUNTER — Other Ambulatory Visit: Payer: Self-pay

## 2019-05-14 DIAGNOSIS — R6889 Other general symptoms and signs: Secondary | ICD-10-CM | POA: Diagnosis not present

## 2019-05-14 DIAGNOSIS — Z20822 Contact with and (suspected) exposure to covid-19: Secondary | ICD-10-CM

## 2019-05-16 LAB — NOVEL CORONAVIRUS, NAA: SARS-CoV-2, NAA: NOT DETECTED

## 2019-10-19 ENCOUNTER — Emergency Department (HOSPITAL_COMMUNITY): Payer: BC Managed Care – PPO

## 2019-10-19 ENCOUNTER — Other Ambulatory Visit: Payer: Self-pay

## 2019-10-19 ENCOUNTER — Emergency Department (HOSPITAL_COMMUNITY)
Admission: EM | Admit: 2019-10-19 | Discharge: 2019-10-19 | Disposition: A | Discharge status: Home / Self Care | Payer: BC Managed Care – PPO | Attending: Emergency Medicine | Admitting: Emergency Medicine

## 2019-10-19 ENCOUNTER — Encounter (HOSPITAL_COMMUNITY): Payer: Self-pay

## 2019-10-19 DIAGNOSIS — F17210 Nicotine dependence, cigarettes, uncomplicated: Secondary | ICD-10-CM | POA: Diagnosis not present

## 2019-10-19 DIAGNOSIS — R079 Chest pain, unspecified: Secondary | ICD-10-CM | POA: Diagnosis not present

## 2019-10-19 DIAGNOSIS — Z79899 Other long term (current) drug therapy: Secondary | ICD-10-CM | POA: Insufficient documentation

## 2019-10-19 DIAGNOSIS — R0789 Other chest pain: Secondary | ICD-10-CM | POA: Diagnosis not present

## 2019-10-19 LAB — BASIC METABOLIC PANEL
Anion gap: 8 (ref 5–15)
BUN: 15 mg/dL (ref 6–20)
CO2: 26 mmol/L (ref 22–32)
Calcium: 9.3 mg/dL (ref 8.9–10.3)
Chloride: 106 mmol/L (ref 98–111)
Creatinine, Ser: 0.86 mg/dL (ref 0.61–1.24)
GFR calc Af Amer: >60 mL/min (ref >60)
GFR calc non Af Amer: >60 mL/min (ref >60)
Glucose, Bld: 95 mg/dL (ref 70–99)
Potassium: 4.1 mmol/L (ref 3.5–5.1)
Sodium: 140 mmol/L (ref 135–145)

## 2019-10-19 LAB — CBC WITH DIFFERENTIAL/PLATELET
Abs Immature Granulocytes: 0.03 10*3/uL (ref 0.00–0.07)
Basophils Absolute: 0 10*3/uL (ref 0.0–0.1)
Basophils Relative: 1 %
Eosinophils Absolute: 0.1 10*3/uL (ref 0.0–0.5)
Eosinophils Relative: 1 %
HCT: 45.4 % (ref 39.0–52.0)
Hemoglobin: 14.9 g/dL (ref 13.0–17.0)
Immature Granulocytes: 0 %
Lymphocytes Relative: 34 %
Lymphs Abs: 2.7 10*3/uL (ref 0.7–4.0)
MCH: 32.8 pg (ref 26.0–34.0)
MCHC: 32.8 g/dL (ref 30.0–36.0)
MCV: 100 fL (ref 80.0–100.0)
Monocytes Absolute: 0.5 10*3/uL (ref 0.1–1.0)
Monocytes Relative: 7 %
Neutro Abs: 4.5 10*3/uL (ref 1.7–7.7)
Neutrophils Relative %: 57 %
Platelets: 279 10*3/uL (ref 150–400)
RBC: 4.54 MIL/uL (ref 4.22–5.81)
RDW: 12.6 % (ref 11.5–15.5)
WBC: 7.9 10*3/uL (ref 4.0–10.5)
nRBC: 0.3 % — ABNORMAL HIGH (ref 0.0–0.2)

## 2019-10-19 LAB — TROPONIN I (HIGH SENSITIVITY)
Troponin I (High Sensitivity): 2 ng/L (ref <18)
Troponin I (High Sensitivity): 4 ng/L (ref <18)

## 2019-10-19 MED ORDER — METHOCARBAMOL 500 MG PO TABS: 500.0000 mg | ORAL_TABLET | Freq: Three times a day (TID) | ORAL | 0 refills | Status: DC | PRN

## 2019-10-19 MED ORDER — NAPROXEN 500 MG PO TABS: 500.0000 mg | ORAL_TABLET | Freq: Two times a day (BID) | ORAL | 0 refills | Status: DC

## 2019-10-19 NOTE — ED Notes (Signed)
Pt ambulatory to Xray.

## 2019-10-19 NOTE — ED Provider Notes (Signed)
[facility] DEPT Provider Note   CSN: [id number] Arrival date & time: 10/19/19  1132     History Chief Complaint  Patient presents with  . Chest Pain    Jesse King is a 54 y.o. male with a hx of tobacco abuse, GERD, & chronic back pain who presents to the ED with complaints of chest pain that began @ 10:00. Patient states pain is located to the central chest, it feels like a dull/ache/pressure, it is intermittent, episodes last a few minutes, seems to be alleviated/aggravated based on position.  Feels better when laying down or standing, worse when seated or with certain twisting/turning movements.  No change with exertion or deep breath.  No specific associated symptoms.  He denies nausea, vomiting, diaphoresis, dyspnea, lightheadedness, dizziness, syncope, leg pain/swelling, hemoptysis, recent surgery/trauma, recent long travel, hormone use, personal hx of cancer, or hx of DVT/PE.  No recent injury, change in activity, or heavy lifting that he can recall.   HPI     Past Medical History:  Diagnosis Date  . Chronic back pain    muscle spasms  . Fatty liver    Per CT scan   . GERD (gastroesophageal reflux disease)     Patient Active Problem List   Diagnosis Date Noted  . Nicotine addiction 08/16/2016  . Tobacco use disorder 08/16/2016    Past Surgical History:  Procedure Laterality Date  . APPENDECTOMY    . BACK SURGERY    . KNEE SURGERY    . WISDOM TOOTH EXTRACTION         Family History  Problem Relation Age of Onset  . Diabetes Father   . Heart disease Father   . Breast cancer Maternal Grandmother   . Lung cancer Maternal Grandmother   . Colon cancer Neg Hx   . Liver cancer Neg Hx   . Esophageal cancer Neg Hx   . Pancreatic cancer Neg Hx   . Prostate cancer Neg Hx   . Rectal cancer Neg Hx   . Stomach cancer Neg Hx     Social History   Tobacco Use  . Smoking status: Current Every Day Smoker    Packs/day: 0.50   Years: 30.00    Pack years: 15.00    Types: Cigarettes  . Smokeless tobacco: Never Used  . Tobacco comment: "not quite a pack per day"  Substance Use Topics  . Alcohol use: Not Currently    Alcohol/week: 0.0 standard drinks    Comment: quit drinking 3 years ago  . Drug use: No    Home Medications Prior to Admission medications   Medication Sig Start Date End Date Taking? Authorizing Provider  cyclobenzaprine (FLEXERIL) 5 MG tablet Take 1 tablet (5 mg total) by mouth at bedtime as needed for muscle spasms. 11/29/17   Johnn Hai, PA-C  ibuprofen (ADVIL,MOTRIN) 800 MG tablet Take 1 tablet (800 mg total) by mouth every 8 (eight) hours as needed. Patient not taking: Reported on 12/15/2017 02/15/17   Long, [name], MD  methocarbamol (ROBAXIN) 500 MG tablet Take 1 tablet (500 mg total) by mouth 4 (four) times daily. 12/21/17   [name], PA  naproxen (NAPROSYN) 375 MG tablet Take 1 tablet (375 mg total) by mouth 2 (two) times daily with a meal. 11/05/16   Blanchie Dessert, MD  omeprazole (PRILOSEC) 20 MG capsule Take 1 capsule (20 mg total) by mouth daily. 10/10/17   [name], PA  polyethylene glycol powder (GLYCOLAX/MIRALAX) powder  Take 17 g by mouth 2 (two) times daily as needed. 10/10/17   Joretta Bachelor, PA  predniSONE (DELTASONE) 10 MG tablet Take 6 tablets  today, on day 2 take 5 tablets, day 3 take 4 tablets, day 4 take 3 tablets, day 5 take  2 tablets and 1 tablet the last day 11/29/17   Johnn Hai, PA-C  traMADol (ULTRAM) 50 MG tablet Take 1 tablet (50 mg total) by mouth every 6 (six) hours as needed. 02/15/17   Long, [name], MD    Allergies    Amoxicillin, Codeine, and Tamiflu [oseltamivir]  Review of Systems   Review of Systems  Constitutional: Negative for chills, diaphoresis and fever.  Respiratory: Negative for cough and shortness of breath.        Negative for hemoptysis  Cardiovascular: Positive for chest pain. Negative for leg swelling.    Gastrointestinal: Negative for abdominal pain, constipation, diarrhea, nausea and vomiting.  Genitourinary: Negative for dysuria.  Neurological: Negative for dizziness, syncope, weakness, light-headedness and numbness.  All other systems reviewed and are negative.   Physical Exam Updated Vital Signs BP 132/78 (BP Location: Left Arm)   Pulse 92   Temp 99.3 F (37.4 C) (Oral)   Resp 17   Ht 5\' 8"  (1.727 m)   Wt 70.3 kg   SpO2 98%   BMI 23.57 kg/m   Physical Exam Vitals and nursing note reviewed.  Constitutional:      General: He is not in acute distress.    Appearance: He is well-developed. He is not toxic-appearing.  HENT:     Head: Normocephalic and atraumatic.  Eyes:     General:        Right eye: No discharge.        Left eye: No discharge.     Conjunctiva/sclera: Conjunctivae normal.  Cardiovascular:     Rate and Rhythm: Normal rate and regular rhythm.     Pulses:          Radial pulses are 2+ on the right side and 2+ on the left side.  Pulmonary:     Effort: Pulmonary effort is normal. No respiratory distress.     Breath sounds: Normal breath sounds. No wheezing, rhonchi or rales.  Chest:     Chest wall: Tenderness (Anterior chest wall) present.  Abdominal:     General: There is no distension.     Palpations: Abdomen is soft.     Tenderness: There is no abdominal tenderness.  Musculoskeletal:     Cervical back: Neck supple.     Right lower leg: No tenderness. No edema.     Left lower leg: No tenderness. No edema.  Skin:    General: Skin is warm and dry.     Findings: No rash.  Neurological:     Mental Status: He is alert.     Comments: Clear speech.   Psychiatric:        Behavior: Behavior normal.     ED Results / Procedures / Treatments   Labs (all labs ordered are listed, but only abnormal results are displayed) Labs Reviewed  CBC WITH DIFFERENTIAL/PLATELET - Abnormal; Notable for the following components:      Result Value   nRBC 0.3 (*)     All other components within normal limits  BASIC METABOLIC PANEL  TROPONIN I (HIGH SENSITIVITY)  TROPONIN I (HIGH SENSITIVITY)    EKG EKG Interpretation  Date/Time:  Friday October 19 2019 11:45:13 EST Ventricular Rate:  89  PR Interval:    QRS Duration: 87 QT Interval:  345 QTC Calculation: 420 R Axis:   54 Text Interpretation: Sinus or ectopic atrial rhythm Left atrial enlargement RSR' in V1 or V2, right VCD or RVH Nonspecific T abnormalities, lateral leads Baseline wander in lead(s) V2 No prior ECG for comparison. No STEMI Confirmed by Antony Blackbird [phone]) on 10/19/2019 12:43:06 PM   Radiology DG Chest 2 View  Result Date: 10/19/2019 CLINICAL DATA:  Chest pain EXAM: CHEST - 2 VIEW COMPARISON:  None. FINDINGS: The heart size and mediastinal contours are within normal limits. Both lungs are clear. The visualized skeletal structures are unremarkable. IMPRESSION: No active cardiopulmonary disease. Electronically Signed   By: Davina Poke D.O.   On: 10/19/2019 12:42    Procedures Procedures (including critical care time)  Medications Ordered in ED Medications - No data to display  ED Course  I have reviewed the triage vital signs and the nursing notes.  Pertinent labs & imaging results that were available during my care of the patient were reviewed by me and considered in my medical decision making (see chart for details).  Jesse King was evaluated in Emergency Department on 10/19/2019 for the symptoms described in the history of present illness. He/she was evaluated in the context of the global COVID-19 pandemic, which necessitated consideration that the patient might be at risk for infection with the SARS-CoV-2 virus that causes COVID-19. Institutional protocols and algorithms that pertain to the evaluation of patients at risk for COVID-19 are in a state of rapid change based on information released by regulatory bodies including the CDC and federal and state organizations.  These policies and algorithms were followed during the patient's care in the ED.    MDM Rules/Calculators/A&P                      Patient presents to the emergency department with chest pain. Patient nontoxic appearing, in no apparent distress, vitals without significant abnormality. Reproducibility of chest pain w/ anterior chest wall palpation & with twisting/turning motions. Fairly benign physical exam.   Prior heart catheterization reviewed: N/A Patient's primary cardiologist: N/A  DDX: ACS, pulmonary embolism, dissection, pneumothorax, pneumonia, arrhythmia, severe anemia, MSK, GERD, anxiety. Evaluation initiated with labs, EKG, and CXR. Patient on cardiac monitor.   CBC: No anemia/leukocytosis BMP: No significant electrolyte derangement.  Troponin: No significant elevation or delta elevation.  EKG: nonspecific T wave changes, no STEMI CXR: Negative, without infiltrate, effusion, pneumothorax, or fracture/dislocation.   [place] Pathway Score low risk- EKG nonspecific T wave changes, no STEMI, delta troponin negative, doubt ACS. Patient is low risk wells,  doubt pulmonary embolism. Pain is not a tearing sensation, symmetric pulses, no widening of mediastinum on CXR, doubt dissection. Cardiac monitor reviewed, no notable arrhythmias or tachycardia.  Unclear definitive etiology, possibly musculoskeletal given tenderness anterior chest wall.  Will trial naproxen and Robaxin.  PCP follow-up.. I discussed results, treatment plan, need for PCP follow-up, and return precautions with the patient. Provided opportunity for questions, patient confirmed understanding and is in agreement with plan.   Final Clinical Impression(s) / ED Diagnoses Final diagnoses:  Chest pain, unspecified type    Rx / DC Orders ED Discharge Orders         Ordered    naproxen (NAPROSYN) 500 MG tablet  2 times daily     10/19/19 1633    methocarbamol (ROBAXIN) 500 MG tablet  Every 8 hours PRN     10/19/19 1633  Amaryllis Dyke, [place] 10/19/19 1634    Tegeler, Gwenyth Allegra, MD 10/19/19 [phone]

## 2019-10-19 NOTE — Discharge Instructions (Addendum)
You were seen in the emergency department today for chest pain. Your work-up in the emergency department has been overall reassuring. Your labs have been fairly normal and or similar to previous blood work you have had done. Your EKG and the enzyme we use to check your heart did not show an acute heart attack at this time. Your chest x-ray was normal.   We are sending you home with the following medicines to help with the pain:  - Naproxen is a nonsteroidal anti-inflammatory medication that will help with pain and swelling. Be sure to take this medication as prescribed with food, 1 pill every 12 hours,  It should be taken with food, as it can cause stomach upset, and more seriously, stomach bleeding. Do not take other nonsteroidal anti-inflammatory medications with this such as Advil, Motrin, Aleve, Mobic, Goodie Powder, or Motrin.    - Robaxin is the muscle relaxer I have prescribed, this is meant to help with muscle tightness. Be aware that this medication may make you drowsy therefore the first time you take this it should be at a time you are in an environment where you can rest. Do not drive or operate heavy machinery when taking this medication. Do not drink alcohol or take other sedating medications with this medicine such as narcotics or benzodiazepines.   You make take Tylenol per over the counter dosing with these medications.   We have prescribed you new medication(s) today. Discuss the medications prescribed today with your pharmacist as they can have adverse effects and interactions with your other medicines including over the counter and prescribed medications. Seek medical evaluation if you start to experience new or abnormal symptoms after taking one of these medicines, seek care immediately if you start to experience difficulty breathing, feeling of your throat closing, facial swelling, or rash as these could be indications of a more serious allergic reaction   We would like you to  follow up closely with your primary care provider and/or the cardiologist provided in your discharge instructions within 1-3 days. Return to the ER immediately should you experience any new or worsening symptoms including but not limited to return of pain, worsened pain, vomiting, shortness of breath, dizziness, lightheadedness, passing out, or any other concerns that you may have.

## 2019-10-19 NOTE — ED Triage Notes (Signed)
Patient c/o intermittent mid chest pain since 1000 today. Patient denies any SOB.

## 2019-10-19 NOTE — ED Notes (Signed)
Pt ambulatory to bathroom, no assistance needed.  

## 2019-12-27 ENCOUNTER — Emergency Department (HOSPITAL_COMMUNITY): Payer: BC Managed Care – PPO

## 2019-12-27 ENCOUNTER — Emergency Department (HOSPITAL_COMMUNITY)
Admission: EM | Admit: 2019-12-27 | Discharge: 2019-12-27 | Disposition: A | Discharge status: Home / Self Care | Payer: BC Managed Care – PPO | Attending: Emergency Medicine | Admitting: Emergency Medicine

## 2019-12-27 ENCOUNTER — Other Ambulatory Visit: Payer: Self-pay

## 2019-12-27 ENCOUNTER — Encounter (HOSPITAL_COMMUNITY): Payer: Self-pay

## 2019-12-27 DIAGNOSIS — R1032 Left lower quadrant pain: Secondary | ICD-10-CM | POA: Insufficient documentation

## 2019-12-27 DIAGNOSIS — R109 Unspecified abdominal pain: Secondary | ICD-10-CM

## 2019-12-27 DIAGNOSIS — R110 Nausea: Secondary | ICD-10-CM | POA: Diagnosis not present

## 2019-12-27 DIAGNOSIS — Z79899 Other long term (current) drug therapy: Secondary | ICD-10-CM | POA: Diagnosis not present

## 2019-12-27 DIAGNOSIS — F17210 Nicotine dependence, cigarettes, uncomplicated: Secondary | ICD-10-CM | POA: Diagnosis not present

## 2019-12-27 LAB — URINALYSIS, ROUTINE W REFLEX MICROSCOPIC
Bilirubin Urine: NEGATIVE
Glucose, UA: NEGATIVE mg/dL
Hgb urine dipstick: NEGATIVE
Ketones, ur: NEGATIVE mg/dL
Leukocytes,Ua: NEGATIVE
Nitrite: NEGATIVE
Protein, ur: NEGATIVE mg/dL
Specific Gravity, Urine: 1.011 (ref 1.005–1.030)
pH: 7 (ref 5.0–8.0)

## 2019-12-27 LAB — CBC
HCT: 45.5 % (ref 39.0–52.0)
Hemoglobin: 14.8 g/dL (ref 13.0–17.0)
MCH: 32.9 pg (ref 26.0–34.0)
MCHC: 32.5 g/dL (ref 30.0–36.0)
MCV: 101.1 fL — ABNORMAL HIGH (ref 80.0–100.0)
Platelets: 271 10*3/uL (ref 150–400)
RBC: 4.5 MIL/uL (ref 4.22–5.81)
RDW: 13.1 % (ref 11.5–15.5)
WBC: 8.6 10*3/uL (ref 4.0–10.5)
nRBC: 0 % (ref 0.0–0.2)

## 2019-12-27 LAB — COMPREHENSIVE METABOLIC PANEL
ALT: 14 U/L (ref 0–44)
AST: 18 U/L (ref 15–41)
Albumin: 3.8 g/dL (ref 3.5–5.0)
Alkaline Phosphatase: 57 U/L (ref 38–126)
Anion gap: 7 (ref 5–15)
BUN: 15 mg/dL (ref 6–20)
CO2: 26 mmol/L (ref 22–32)
Calcium: 8.8 mg/dL — ABNORMAL LOW (ref 8.9–10.3)
Chloride: 107 mmol/L (ref 98–111)
Creatinine, Ser: 0.92 mg/dL (ref 0.61–1.24)
GFR calc Af Amer: >60 mL/min (ref >60)
GFR calc non Af Amer: >60 mL/min (ref >60)
Glucose, Bld: 112 mg/dL — ABNORMAL HIGH (ref 70–99)
Potassium: 4.2 mmol/L (ref 3.5–5.1)
Sodium: 140 mmol/L (ref 135–145)
Total Bilirubin: 0.8 mg/dL (ref 0.3–1.2)
Total Protein: 6.7 g/dL (ref 6.5–8.1)

## 2019-12-27 LAB — LIPASE, BLOOD: Lipase: 29 U/L (ref 11–51)

## 2019-12-27 MED ORDER — IOHEXOL 300 MG/ML  SOLN
100.0000 mL | Freq: Once | INTRAMUSCULAR | Status: AC | PRN
  Administered 2019-12-27: 100 mL via INTRAVENOUS

## 2019-12-27 MED ORDER — ONDANSETRON HCL 4 MG/2ML IJ SOLN
4.0000 mg | Freq: Once | INTRAMUSCULAR | Status: AC
  Administered 2019-12-27: 4 mg via INTRAVENOUS
  Filled 2019-12-27: qty 2

## 2019-12-27 MED ORDER — FENTANYL CITRATE (PF) 100 MCG/2ML IJ SOLN
50.0000 ug | Freq: Once | INTRAMUSCULAR | Status: AC
  Administered 2019-12-27: 50 ug via INTRAVENOUS
  Filled 2019-12-27: qty 2

## 2019-12-27 MED ORDER — SODIUM CHLORIDE 0.9% FLUSH
3.0000 mL | Freq: Once | INTRAVENOUS | Status: AC
  Administered 2019-12-27: 3 mL via INTRAVENOUS

## 2019-12-27 MED ORDER — SODIUM CHLORIDE 0.9 % IV BOLUS
1000.0000 mL | Freq: Once | INTRAVENOUS | Status: AC
  Administered 2019-12-27: 1000 mL via INTRAVENOUS

## 2019-12-27 NOTE — ED Triage Notes (Signed)
Patient reports lower mid abdominal pain that started around 6am with no hx of it.   Reports sharp constant pain and worse when coughing and c/o some nausea.  8/10 pain  Denies emesis Denies urinary symptoms Denies falling    A/Ox4 Ambulatory in room.

## 2019-12-27 NOTE — ED Provider Notes (Signed)
[facility] DEPT Provider Note   CSN: [id number] Arrival date & time: 12/27/19  [id number]     History Chief Complaint  Patient presents with  . Abdominal Pain    Jesse King is a 54 y.o. male.  The history is provided by the patient.  Abdominal Pain Pain location:  LLQ Pain quality: aching   Pain radiates to:  Does not radiate Pain severity:  Mild Onset quality:  Gradual Timing:  Constant Progression:  Unchanged Chronicity:  New Context: not previous surgeries   Relieved by:  Nothing Worsened by:  Nothing Associated symptoms: nausea   Associated symptoms: no chest pain, no chills, no constipation, no cough, no diarrhea, no dysuria, no fever, no hematuria, no shortness of breath, no sore throat and no vomiting   Risk factors: multiple surgeries        Past Medical History:  Diagnosis Date  . Chronic back pain    muscle spasms  . Fatty liver    Per CT scan   . GERD (gastroesophageal reflux disease)     Patient Active Problem List   Diagnosis Date Noted  . Nicotine addiction 08/16/2016  . Tobacco use disorder 08/16/2016    Past Surgical History:  Procedure Laterality Date  . APPENDECTOMY    . BACK SURGERY    . KNEE SURGERY    . WISDOM TOOTH EXTRACTION         Family History  Problem Relation Age of Onset  . Diabetes Father   . Heart disease Father   . Breast cancer Maternal Grandmother   . Lung cancer Maternal Grandmother   . Colon cancer Neg Hx   . Liver cancer Neg Hx   . Esophageal cancer Neg Hx   . Pancreatic cancer Neg Hx   . Prostate cancer Neg Hx   . Rectal cancer Neg Hx   . Stomach cancer Neg Hx     Social History   Tobacco Use  . Smoking status: Current Every Day Smoker    Packs/day: 0.50    Years: 30.00    Pack years: 15.00    Types: Cigarettes  . Smokeless tobacco: Never Used  . Tobacco comment: "not quite a pack per day"  Substance Use Topics  . Alcohol use: Not Currently    Alcohol/week: 0.0  standard drinks    Comment: quit drinking 3 years ago  . Drug use: No    Home Medications Prior to Admission medications   Medication Sig Start Date End Date Taking? Authorizing Provider  ascorbic acid (VITAMIN C) 500 MG tablet Take 500 mg by mouth daily.   Yes [provider]  BIOTIN PO Take 1 tablet by mouth daily.   Yes [provider]  Cholecalciferol (VITAMIN D3 PO) Take 1 tablet by mouth daily.   Yes [provider]  Cyanocobalamin (VITAMIN B-12 PO) Take 1 tablet by mouth daily.   Yes [provider]  VITAMIN E PO Take 1 tablet by mouth daily.   Yes [provider]  ibuprofen (ADVIL,MOTRIN) 800 MG tablet Take 1 tablet (800 mg total) by mouth every 8 (eight) hours as needed. Patient not taking: Reported on 12/27/2019 02/15/17   [name], [name], MD  methocarbamol (ROBAXIN) 500 MG tablet Take 1 tablet (500 mg total) by mouth every 8 (eight) hours as needed. Patient not taking: Reported on 12/27/2019 10/19/19   [name], [name], PA-C  naproxen (NAPROSYN) 500 MG tablet Take 1 tablet (500 mg total) by mouth 2 (  two) times daily. Patient not taking: Reported on 12/27/2019 10/19/19   [name], [name], PA-C  traMADol (ULTRAM) 50 MG tablet Take 1 tablet (50 mg total) by mouth every 6 (six) hours as needed. Patient not taking: Reported on 10/19/2019 02/15/17   [name], [name], MD  omeprazole (PRILOSEC) 20 MG capsule Take 1 capsule (20 mg total) by mouth daily. Patient not taking: Reported on 10/19/2019 10/10/17 10/19/19  [name], PA    Allergies    Codeine, Tamiflu [oseltamivir], and Amoxicillin  Review of Systems   Review of Systems  Constitutional: Negative for chills and fever.  HENT: Negative for ear pain and sore throat.   Eyes: Negative for pain and visual disturbance.  Respiratory: Negative for cough and shortness of breath.   Cardiovascular: Negative for chest pain and palpitations.  Gastrointestinal: Positive for abdominal pain  and nausea. Negative for abdominal distention, anal bleeding, blood in stool, constipation, diarrhea, rectal pain and vomiting.  Genitourinary: Negative for dysuria and hematuria.  Musculoskeletal: Negative for arthralgias and back pain.  Skin: Negative for color change and rash.  Neurological: Negative for seizures and syncope.  All other systems reviewed and are negative.   Physical Exam Updated Vital Signs BP 110/73 (BP Location: Left Arm)   Pulse 80   Temp 98.3 F (36.8 C) (Oral)   Resp 18   SpO2 100%   Physical Exam Vitals and nursing note reviewed.  Constitutional:      General: He is not in acute distress.    Appearance: He is well-developed. He is not ill-appearing.  HENT:     Head: Normocephalic and atraumatic.     Mouth/Throat:     Mouth: Mucous membranes are moist.  Eyes:     Extraocular Movements: Extraocular movements intact.     Conjunctiva/sclera: Conjunctivae normal.  Cardiovascular:     Rate and Rhythm: Normal rate and regular rhythm.     Heart sounds: Normal heart sounds. No murmur.  Pulmonary:     Effort: Pulmonary effort is normal. No respiratory distress.     Breath sounds: Normal breath sounds.  Abdominal:     Palpations: Abdomen is soft.     Tenderness: There is abdominal tenderness in the suprapubic area and left lower quadrant. There is no right CVA tenderness, left CVA tenderness, guarding or rebound. Negative signs include Murphy's sign, Rovsing's sign, McBurney's sign and psoas sign.  Musculoskeletal:     Cervical back: Neck supple.  Skin:    General: Skin is warm and dry.     Capillary Refill: Capillary refill takes less than 2 seconds.  Neurological:     General: No focal deficit present.     Mental Status: He is alert.  Psychiatric:        Mood and Affect: Mood normal.     ED Results / Procedures / Treatments   Labs (all labs ordered are listed, but only abnormal results are displayed) Labs Reviewed  CBC - Abnormal; Notable for the  following components:      Result Value   MCV 101.1 (*)    All other components within normal limits  COMPREHENSIVE METABOLIC PANEL - Abnormal; Notable for the following components:   Glucose, Bld 112 (*)    Calcium 8.8 (*)    All other components within normal limits  URINALYSIS, ROUTINE W REFLEX MICROSCOPIC  LIPASE, BLOOD    EKG None  Radiology CT ABDOMEN PELVIS W CONTRAST  Result Date: 12/27/2019 CLINICAL DATA:  Left lower quadrant pain,  abdominal distention EXAM: CT ABDOMEN AND PELVIS WITH CONTRAST TECHNIQUE: Multidetector CT imaging of the abdomen and pelvis was performed using the standard protocol following bolus administration of intravenous contrast. CONTRAST:  138mL OMNIPAQUE IOHEXOL 300 MG/ML  SOLN COMPARISON:  None. FINDINGS: Lower chest: Dependent atelectasis.  No acute abnormality. Hepatobiliary: Possible layering gallstone within the gallbladder. No focal hepatic abnormality or biliary ductal dilatation. Pancreas: No focal abnormality or ductal dilatation. Spleen: No focal abnormality.  Normal size. Adrenals/Urinary Tract: No adrenal abnormality. No focal renal abnormality. No stones or hydronephrosis. Urinary bladder is unremarkable. Stomach/Bowel: Stomach, large and small bowel grossly unremarkable. Vascular/Lymphatic: Aortic atherosclerosis. No enlarged abdominal or pelvic lymph nodes. Reproductive: No visible focal abnormality. Other: No free fluid or free air. Musculoskeletal: No acute bony abnormality. IMPRESSION: Possible small layering gallstone within the gallbladder. Aortic atherosclerosis. No acute findings in the abdomen or pelvis. Electronically Signed   By: Rolm Baptise M.D.   On: 12/27/2019 09:21    Procedures Procedures (including critical care time)  Medications Ordered in ED Medications  sodium chloride flush (NS) 0.9 % injection 3 mL (3 mLs Intravenous Given 12/27/19 0734)  sodium chloride 0.9 % bolus 1,000 mL (0 mLs Intravenous Stopped 12/27/19 0859)    ondansetron (ZOFRAN) injection 4 mg (4 mg Intravenous Given 12/27/19 0759)  fentaNYL (SUBLIMAZE) injection 50 mcg (50 mcg Intravenous Given 12/27/19 0759)  iohexol (OMNIPAQUE) 300 MG/ML solution 100 mL (100 mLs Intravenous Contrast Given 12/27/19 0911)    ED Course  I have reviewed the triage vital signs and the nursing notes.  Pertinent labs & imaging results that were available during my care of the patient were reviewed by me and considered in my medical decision making (see chart for details).    MDM Rules/Calculators/A&P                      KADIAN KENNERSON is a 54 year old male with history of reflux who presents to the ED with abdominal pain.  Normal vitals.  No fever. 10:12 AM Pain started this morning.  Mostly left lower quadrant, suprapubic.  Some nausea but no vomiting.  No diarrhea.  No urinary symptoms.  History of colon polyps.  History of appendectomy.  Possibly patient with diverticulitis versus bowel obstruction versus UTI.  Less likely kidney stones.  Will evaluate with lab work including CT scan of abdomen pelvis.  IV fluids, pain medication, IV antiemetics given.  Will reevaluate.  Pain has improved.  Lab work is overall unremarkable.  No significant anemia, electrolyte abnormality, kidney injury.  CT scan of the abdomen and pelvis shows gallstones but no other intra-abdominal process.  Patient does not have pain in the right upper quadrant on exam.  Hepatobiliary labs are normal.  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  Recommend Tylenol Motrin.  Discharged in ED in good condition.  No urinary tract infection.  This chart was dictated using voice recognition software.  Despite best efforts to proofread,  errors can occur which can change the documentation meaning.    Final Clinical Impression(s) / ED Diagnoses Final diagnoses:  Abdominal pain, unspecified abdominal location    Rx / DC Orders ED Discharge Orders    None       Lennice Sites, DO 12/27/19 1012

## 2019-12-27 NOTE — Discharge Instructions (Signed)
Suspect that you have a muscle related pain.  Take 800 mg of Motrin every 8 hours for the next 5 days and then as needed.  Please refrain from any heavy lifting for the next several days.

## 2019-12-27 NOTE — ED Notes (Signed)
Another light green gel tube sent to lab; Freda Munro in main lab aware and verbalizes will process new sample.

## 2020-01-08 ENCOUNTER — Encounter: Payer: Self-pay | Admitting: Family Medicine

## 2020-01-08 ENCOUNTER — Ambulatory Visit: Payer: BC Managed Care – PPO | Admitting: Family Medicine

## 2020-01-08 ENCOUNTER — Ambulatory Visit (INDEPENDENT_AMBULATORY_CARE_PROVIDER_SITE_OTHER): Payer: BC Managed Care – PPO

## 2020-01-08 ENCOUNTER — Other Ambulatory Visit: Payer: Self-pay

## 2020-01-08 VITALS — BP 109/69 | HR 89 | Temp 98.3°F | Resp 17 | Ht 68.0 in | Wt 153.4 lb

## 2020-01-08 DIAGNOSIS — S99921A Unspecified injury of right foot, initial encounter: Secondary | ICD-10-CM

## 2020-01-08 DIAGNOSIS — Z114 Encounter for screening for human immunodeficiency virus [HIV]: Secondary | ICD-10-CM | POA: Diagnosis not present

## 2020-01-08 DIAGNOSIS — R1032 Left lower quadrant pain: Secondary | ICD-10-CM

## 2020-01-08 DIAGNOSIS — S93509A Unspecified sprain of unspecified toe(s), initial encounter: Secondary | ICD-10-CM

## 2020-01-08 DIAGNOSIS — Z23 Encounter for immunization: Secondary | ICD-10-CM | POA: Diagnosis not present

## 2020-01-08 NOTE — Patient Instructions (Addendum)
   If you have lab work done today you will be contacted with your lab results within the next 2 weeks.  If you have not heard from us then please contact us. The fastest way to get your results is to register for My Chart.   IF you received an x-ray today, you will receive an invoice from [facility]. Please contact [facility] at [phone] with questions or concerns regarding your invoice.   IF you received labwork today, you will receive an invoice from [facility]. Please contact [facility] at [phone] with questions or concerns regarding your invoice.   Our billing staff will not be able to assist you with questions regarding bills from these companies.  You will be contacted with the lab results as soon as they are available. The fastest way to get your results is to activate your My Chart account. Instructions are located on the last page of this paperwork. If you have not heard from us regarding the results in 2 weeks, please contact this office.      Foot Sprain  A foot sprain is an injury to one of the strong bands of tissue (ligaments) that connect and support the bones in your feet. The ligament can be stretched too much and tear. A tear can be either partial or complete. The severity of the sprain depends on how much of the ligament was damaged or torn. What are the causes? This condition is usually caused by suddenly twisting or pivoting your foot. What increases the risk? This injury is more likely to occur in people who:  Play a sport, such as basketball or football.  Exercise or play a sport without warming up.  Start a new workout or sport.  Suddenly increase how long or hard they exercise or play a sport.  Have previously injured their foot or ankle. What are the signs or symptoms? Symptoms of this condition start soon after an injury and include:  Pain, especially in the arch of the foot.  Bruising.  Swelling.  Inability to walk or  use the foot to support body weight. How is this diagnosed? This condition is diagnosed with a medical history and physical exam. You may also have imaging tests, such as:  X-rays to make sure there are no broken bones (fractures).  An MRI to see if the ligament is torn. How is this treated? Treatment for this condition depends on the severity of the sprain.  Mild sprains can be treated with: ? Rest, ice, compression, and elevation (RICE). ? Keeping your foot in a fixed position (immobilization) for a period of time. This is done if your ligament is overstretched or partially torn. Your health care provider will apply a bandage, splint, or walking boot to keep your foot from moving until it heals. ? Using crutches or a scooter for a few weeks to avoid putting weight on your foot while it is healing.  Major sprains can be treated with: ? Surgery. This is done if your ligament is fully torn and a procedure is needed to reconnect it to the bone. ? A cast or splint. This will be needed after surgery. A cast or splint will need to stay on your foot while it heals.  In both types of sprains, you may need to exercise or have physical therapy to strengthen your foot. Follow these instructions at home: If you have a bandage, splint, or boot:  Wear the bandage, splint, or boot as told by your health   care provider. Remove only as told by your health care provider.  Loosen the bandage, splint, or boot if your toes tingle, become numb, or turn cold and blue.  Keep the bandage, splint, or boot clean and dry. If you have a cast:  Do not stick anything inside the cast to scratch your skin. Doing that increases your risk for infection.  Check the skin around the cast every day. Tell your health care provider about any concerns.  You may put lotion on dry skin around the edges of the cast. Do not put lotion on the skin underneath the cast.  Keep the cast clean and dry. Bathing  Do not take  baths, swim, or use a hot tub until your health care provider approves. Ask your health care provider if you may take showers. You may only be allowed to take sponge baths.  If the bandage, splint, boot, or cast is not waterproof: ? Do not let it get wet. ? Cover it with a watertight covering when you take a shower. Managing pain, stiffness, and swelling   If directed, put ice on the injured area: ? If you have a removable splint, boot, or immobilizer, remove it as told by your health care provider. ? Put ice in a plastic bag. ? Place a towel between your skin and the bag. ? Leave the ice on for 20 minutes, 2-3 times per day.  Move your toes often to avoid stiffness and to lessen swelling.  Raise (elevate) the injured area above the level of your heart while you are sitting or lying down. Driving  Do not drive or operate heavy machinery while taking pain medicine.  Ask your health care provider when it is safe to drive if you have a bandage, splint, or walking boot on your foot. Activity  Do not use the injured foot to support your body weight until your health care provider says that you can. Use crutches or other supportive devices as directed by your health care provider.  Ask your health care provider what activities are safe for you. Do any exercise or physical therapy as directed.  Gradually increase how much and how far you walk until your health care provider says it is safe to return to full activity. General instructions  If you have a cast, do not put pressure on any part of it until it is fully hardened. This may take several hours.  Take over-the-counter and prescription medicines only as told by your health care provider.  When you can walk without pain, wear supportive shoes that have stiff soles. Do not wear flip-flops, and do not walk barefoot.  Keep all follow-up visits as told by your health care provider. This is important. Contact a health care provider  if:  Your pain is not controlled with medicine.  Your bruising or swelling gets worse or does not get better with treatment.  Your splint, boot, or cast is damaged. Get help right away if:  You develop severe numbness or tingling in your foot.  Your foot turns blue, white, or gray, and it feels cold. Summary  A foot sprain is an injury to one of the strong bands of tissue (ligaments) that connect and support the bones in your feet.  Your health care provider may recommend a splint or boot for your foot to support it while it heals. In some cases, surgery may be needed.  Physical therapy can help keep your other muscles strong until your foot gets better.   This information is not intended to replace advice given to you by your health care provider. Make sure you discuss any questions you have with your health care provider. Document Revised: 09/03/2017 Document Reviewed: 09/03/2017 [employer] Patient Education  2020 [employer]  

## 2020-01-08 NOTE — Progress Notes (Signed)
Established Patient Office Visit  Subjective:  Patient ID: Jesse King, male    DOB: 19-Dec-1965  Age: 54 y.o. MRN: [id number]  CC:  Chief Complaint  Patient presents with  . Hospitalization Follow-up    abdominal pain.  Per pt he is better.  Per pt he hit toe next to great toe on right foot and it is blue and swollen, onset: Sunday night 01/06/2020.  Pain level 10/10 with movement or it he brushes up against anything    HPI Jesse King presents for   Patient was seen in the ER for LLQ abdominal pain He had CT scan and labs  He reports that he was told it was a pulled muscle His pain has resolved  He states that he feels better and wants to go back to work.   HOWEVER   Since he is  Here he wants to discuss his right great toe pain. He states that he hit his foot and the toe is blue and swollen. He is worried about a fracture.   He can move his ankle and his other toes but the right great toe hurts. He denies history of gout. He "banged it really good and immediately thought it is broken"  Past Medical History:  Diagnosis Date  . Chronic back pain    muscle spasms  . Fatty liver    Per CT scan   . GERD (gastroesophageal reflux disease)     Past Surgical History:  Procedure Laterality Date  . APPENDECTOMY    . BACK SURGERY    . KNEE SURGERY    . WISDOM TOOTH EXTRACTION      Family History  Problem Relation Age of Onset  . Diabetes Father   . Heart disease Father   . Breast cancer Maternal Grandmother   . Lung cancer Maternal Grandmother   . Colon cancer Neg Hx   . Liver cancer Neg Hx   . Esophageal cancer Neg Hx   . Pancreatic cancer Neg Hx   . Prostate cancer Neg Hx   . Rectal cancer Neg Hx   . Stomach cancer Neg Hx     Social History   Socioeconomic History  . Marital status: Divorced    Spouse name: Not on file  . Number of children: 0  . Years of education: Not on file  . Highest education level: Not on file  Occupational History  . Not on  file  Tobacco Use  . Smoking status: Current Every Day Smoker    Packs/day: 0.50    Years: 30.00    Pack years: 15.00    Types: Cigarettes  . Smokeless tobacco: Never Used  . Tobacco comment: "not quite a pack per day"  Substance and Sexual Activity  . Alcohol use: Not Currently    Alcohol/week: 0.0 standard drinks    Comment: quit drinking 3 years ago  . Drug use: No  . Sexual activity: Not on file  Other Topics Concern  . Not on file  Social History Narrative  . Not on file   Social Determinants of Health   Financial Resource Strain:   . Difficulty of Paying Living Expenses:   Food Insecurity:   . Worried About Charity fundraiser in the Last Year:   . Arboriculturist in the Last Year:   Transportation Needs:   . Film/video editor (Medical):   Marland Kitchen Lack of Transportation (Non-Medical):   Physical Activity:   . Days of  Exercise per Week:   . Minutes of Exercise per Session:   Stress:   . Feeling of Stress :   Social Connections:   . Frequency of Communication with Friends and Family:   . Frequency of Social Gatherings with Friends and Family:   . Attends Religious Services:   . Active Member of Clubs or Organizations:   . Attends Archivist Meetings:   Marland Kitchen Marital Status:   Intimate Partner Violence:   . Fear of Current or Ex-Partner:   . Emotionally Abused:   Marland Kitchen Physically Abused:   . Sexually Abused:     Outpatient Medications Prior to Visit  Medication Sig Dispense Refill  . ascorbic acid (VITAMIN C) 500 MG tablet Take 500 mg by mouth daily.    Marland Kitchen BIOTIN PO Take 1 tablet by mouth daily.    . Cholecalciferol (VITAMIN D3 PO) Take 1 tablet by mouth daily.    . Cyanocobalamin (VITAMIN B-12 PO) Take 1 tablet by mouth daily.    Marland Kitchen ibuprofen (ADVIL,MOTRIN) 800 MG tablet Take 1 tablet (800 mg total) by mouth every 8 (eight) hours as needed. 21 tablet 0  . VITAMIN E PO Take 1 tablet by mouth daily.    . methocarbamol (ROBAXIN) 500 MG tablet Take 1 tablet  (500 mg total) by mouth every 8 (eight) hours as needed. (Patient not taking: Reported on 01/08/2020) 15 tablet 0  . naproxen (NAPROSYN) 500 MG tablet Take 1 tablet (500 mg total) by mouth 2 (two) times daily. (Patient not taking: Reported on 01/08/2020) 10 tablet 0  . traMADol (ULTRAM) 50 MG tablet Take 1 tablet (50 mg total) by mouth every 6 (six) hours as needed. (Patient not taking: Reported on 01/08/2020) 15 tablet 0   Facility-Administered Medications Prior to Visit  Medication Dose Route Frequency Provider Last Rate Last Admin  . 0.9 %  sodium chloride infusion  500 mL Intravenous Once Doran Stabler, MD        Allergies  Allergen Reactions  . Codeine Hives and Nausea And Vomiting  . Tamiflu [Oseltamivir] Hives  . Amoxicillin Nausea And Vomiting and Rash    ROS Review of Systems Review of Systems  Constitutional: Negative for activity change, appetite change, chills and fever.  HENT: Negative for congestion, nosebleeds, trouble swallowing and voice change.   Respiratory: Negative for cough, shortness of breath and wheezing.   Gastrointestinal: Negative for diarrhea, nausea and vomiting.  Genitourinary: Negative for difficulty urinating, dysuria, flank pain and hematuria.  Musculoskeletal: Negative for back pain, joint swelling and neck pain.  Neurological: Negative for dizziness, speech difficulty, light-headedness and numbness.  See HPI. All other review of systems negative.     Objective:    Physical Exam  BP 109/69 (BP Location: Right Arm, Patient Position: Sitting, Cuff Size: Normal)   Pulse 89   Temp 98.3 F (36.8 C) (Temporal)   Resp 17   Ht 5\' 8"  (1.727 m)   Wt 153 lb 6.4 oz (69.6 kg)   SpO2 97%   BMI 23.32 kg/m  Wt Readings from Last 3 Encounters:  01/08/20 153 lb 6.4 oz (69.6 kg)  10/19/19 155 lb (70.3 kg)  12/15/17 140 lb 9.6 oz (63.8 kg)   Physical Exam  Constitutional: Oriented to person, place, and time. Appears well-developed and well-nourished.    HENT:  Head: Normocephalic and atraumatic.  Eyes: Conjunctivae and EOM are normal.  Cardiovascular: Normal rate, regular rhythm, normal heart sounds and intact distal pulses.  No murmur  heard. Pulmonary/Chest: Effort normal and breath sounds normal. No stridor. No respiratory distress. Has no wheezes.  Abdomen: non-distended, normoactive bs, soft, nontender Neurological: Is alert and oriented to person, place, and time.  Skin: Skin is warm. Capillary refill takes less than 2 seconds.  Psychiatric: Has a normal mood and affect. Behavior is normal. Judgment and thought content normal.   Foot exam Right foot second digit with edema and bruising, reduced range of motion Cap refill <2s    CLINICAL DATA:  54 year old male with right foot injury.  EXAM: RIGHT FOOT COMPLETE - 3+ VIEW  COMPARISON:  None.  FINDINGS: Bone mineralization is within normal limits. Degenerative spurring at the calcaneus. First MTP joint space loss and subchondral sclerosis. Minimal hallux valgus and metatarsus primus varus. Other joint spaces and alignment are normal. No acute osseous abnormality identified. No discrete soft tissue injury identified.  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 No acute fracture or dislocation identified.   Electronically Signed   By: Genevie Ann M.D.   On: 01/08/2020 10:25  Health Maintenance Due  Topic Date Due  . COVID-19 Vaccine (1) Never done  . HIV Screening  Never done  . TETANUS/TDAP  Never done    There are no preventive care reminders to display for this patient.  No results found for: TSH Lab Results  Component Value Date   WBC 8.6 12/27/2019   HGB 14.8 12/27/2019   HCT 45.5 12/27/2019   MCV 101.1 (H) 12/27/2019   PLT 271 12/27/2019   Lab Results  Component Value Date   NA 140 12/27/2019   K 4.2 12/27/2019   CO2 26 12/27/2019   GLUCOSE 112 (H) 12/27/2019   BUN 15 12/27/2019   CREATININE 0.92 12/27/2019   BILITOT 0.8  12/27/2019   ALKPHOS 57 12/27/2019   AST 18 12/27/2019   ALT 14 12/27/2019   PROT 6.7 12/27/2019   ALBUMIN 3.8 12/27/2019   CALCIUM 8.8 (L) 12/27/2019   ANIONGAP 7 12/27/2019      Assessment & Plan:   Problem List Items Addressed This Visit    None    Visit Diagnoses    Injury of right foot, initial encounter    -  Primary Discussed that he has a sprain ICE and elevation advised    Relevant Orders   DG Foot Complete Right (Completed)   Screening for HIV (human immunodeficiency virus)       Need for Tdap vaccination       Sprain of toe, initial encounter       LLQ pain    - resolved      No orders of the defined types were placed in this encounter.   Follow-up: Return if symptoms worsen or fail to improve.   A total of 25 minutes were spent face-to-face with the patient during this encounter and over half of that time was spent on counseling and coordination of care.  Forrest Moron, MD

## 2020-02-06 ENCOUNTER — Ambulatory Visit: Payer: BC Managed Care – PPO | Admitting: Family Medicine

## 2020-02-06 ENCOUNTER — Other Ambulatory Visit: Payer: Self-pay

## 2020-02-06 VITALS — BP 116/73 | HR 89 | Temp 98.7°F | Ht 68.0 in | Wt 151.6 lb

## 2020-02-06 DIAGNOSIS — H539 Unspecified visual disturbance: Secondary | ICD-10-CM | POA: Diagnosis not present

## 2020-02-06 DIAGNOSIS — Z131 Encounter for screening for diabetes mellitus: Secondary | ICD-10-CM

## 2020-02-06 DIAGNOSIS — Z0000 Encounter for general adult medical examination without abnormal findings: Secondary | ICD-10-CM

## 2020-02-06 DIAGNOSIS — Z136 Encounter for screening for cardiovascular disorders: Secondary | ICD-10-CM | POA: Diagnosis not present

## 2020-02-06 DIAGNOSIS — F17210 Nicotine dependence, cigarettes, uncomplicated: Secondary | ICD-10-CM

## 2020-02-06 DIAGNOSIS — Z7189 Other specified counseling: Secondary | ICD-10-CM

## 2020-02-06 DIAGNOSIS — Z7185 Encounter for immunization safety counseling: Secondary | ICD-10-CM

## 2020-02-06 DIAGNOSIS — Z23 Encounter for immunization: Secondary | ICD-10-CM

## 2020-02-06 DIAGNOSIS — N529 Male erectile dysfunction, unspecified: Secondary | ICD-10-CM

## 2020-02-06 DIAGNOSIS — Z13220 Encounter for screening for lipoid disorders: Secondary | ICD-10-CM | POA: Diagnosis not present

## 2020-02-06 MED ORDER — VARENICLINE TARTRATE 1 MG PO TABS: 1.0000 mg | ORAL_TABLET | Freq: Two times a day (BID) | ORAL | 2 refills | Status: DC

## 2020-02-06 MED ORDER — CHANTIX STARTING MONTH PAK 0.5 MG X 11 & 1 MG X 42 PO TABS: ORAL_TABLET | ORAL | 0 refills | Status: DC

## 2020-02-06 MED ORDER — SILDENAFIL CITRATE 50 MG PO TABS: 25.0000 mg | ORAL_TABLET | ORAL | 0 refills | Status: DC | PRN

## 2020-02-06 NOTE — Progress Notes (Signed)
Subjective:  Patient ID: Jesse King, male    DOB: [birth year]  Age: 54 y.o. MRN: [id number]  CC:  Chief Complaint  Patient presents with  . New Patient (Initial Visit)    Pt just needs to get under a provider's care. No speciffic concerns    HPI Jesse King presents for  New patient to me to establish care.  History of tobacco use, GERD, fatty liver, back pain per problem list/medical history. No specific concerns.  On otc vitamins.  Had pulled muscles earlier in the year - ok now, no further abd or chest pain   Nicotine addiction 1/2 ppd. Smoking 39 yrs - up to almost 2 packs per day - overall about 1ppd average.  Trying to cut back.  Has tried to quit.  4 min conversation.   Prior alcohol abuse: Up to case beer per night. Quit 6 years ago. Rare alcohol - less than once per year.   Cancer screening: Colon: 12/07/17 - repeat 3 years.  Prostate: declines testing after r/b discussion.  Lung: declines referral for low dose CT lung ca screening.   Immunization History  Administered Date(s) Administered  . Tdap 02/06/2020  Covid vaccine: not planning to get it. Concerned about possible risks. Discussed concerns, and link given for information.   Depression screen [facility] 2/9 02/06/2020 01/08/2020 12/15/2017 10/10/2017 08/16/2016  Decreased Interest 0 0 0 0 0  Down, Depressed, Hopeless 0 0 0 0 0  PHQ - 2 Score 0 0 0 0 0    No exam data present otc reading glasses working ok. Needs eyes checked.   No recent dentist.   Exercise: Runs knitting machines. Walking for 8 hours per day.   Erectile dysfunction: Took viagra in past. No side effects. No vision/hearing difficulty, no chest pains with exertion.  Trouble maintaining erection. No current meds.  EKG 10/19/19 - Sinus rhythm, rate 89, RSR prime in V1 or V2, nonspecific T abnormalities lateral lead with some baseline wander in lead V2.   History Patient Active Problem List   Diagnosis Date Noted  . Nicotine addiction  08/16/2016  . Tobacco use disorder 08/16/2016   Past Medical History:  Diagnosis Date  . Chronic back pain    muscle spasms  . Fatty liver    Per CT scan   . GERD (gastroesophageal reflux disease)    Past Surgical History:  Procedure Laterality Date  . APPENDECTOMY    . BACK SURGERY    . KNEE SURGERY    . WISDOM TOOTH EXTRACTION     Allergies  Allergen Reactions  . Codeine Hives and Nausea And Vomiting  . Tamiflu [Oseltamivir] Hives  . Amoxicillin Nausea And Vomiting and Rash   Prior to Admission medications   Medication Sig Start Date End Date Taking? Authorizing Provider  ascorbic acid (VITAMIN C) 500 MG tablet Take 500 mg by mouth daily.   Yes [provider]  BIOTIN PO Take 1 tablet by mouth daily.   Yes [provider]  Cholecalciferol (VITAMIN D3 PO) Take 1 tablet by mouth daily.   Yes [provider]  Cyanocobalamin (VITAMIN B-12 PO) Take 1 tablet by mouth daily.   Yes [provider]  ibuprofen (ADVIL,MOTRIN) 800 MG tablet Take 1 tablet (800 mg total) by mouth every 8 (eight) hours as needed. 02/15/17  Yes Long, [name], MD  methocarbamol (ROBAXIN) 500 MG tablet Take 1 tablet (500 mg total) by mouth every 8 (eight) hours as needed. 10/19/19  Yes [name], Samantha R, PA-C  naproxen (NAPROSYN) 500 MG tablet Take 1 tablet (500 mg total) by mouth 2 (two) times daily. 10/19/19  Yes [name], Samantha R, PA-C  traMADol (ULTRAM) 50 MG tablet Take 1 tablet (50 mg total) by mouth every 6 (six) hours as needed. 02/15/17  Yes Long, [name], MD  VITAMIN E PO Take 1 tablet by mouth daily.   Yes [provider]  omeprazole (PRILOSEC) 20 MG capsule Take 1 capsule (20 mg total) by mouth daily. Patient not taking: Reported on 10/19/2019 10/10/17 10/19/19  Joretta Bachelor, PA   Social History   Socioeconomic History  . Marital status: Divorced    Spouse name: Not on file  . Number of children: 0  . Years of education: Not on file  .  Highest education level: Not on file  Occupational History  . Not on file  Tobacco Use  . Smoking status: Current Every Day Smoker    Packs/day: 0.50    Years: 30.00    Pack years: 15.00    Types: Cigarettes  . Smokeless tobacco: Never Used  . Tobacco comment: "not quite a pack per day"  Substance and Sexual Activity  . Alcohol use: Not Currently    Alcohol/week: 0.0 standard drinks    Comment: quit drinking 3 years ago  . Drug use: No  . Sexual activity: Not on file  Other Topics Concern  . Not on file  Social History Narrative  . Not on file   Social Determinants of Health   Financial Resource Strain:   . Difficulty of Paying Living Expenses:   Food Insecurity:   . Worried About Charity fundraiser in the Last Year:   . Arboriculturist in the Last Year:   Transportation Needs:   . Film/video editor (Medical):   Marland Kitchen Lack of Transportation (Non-Medical):   Physical Activity:   . Days of Exercise per Week:   . Minutes of Exercise per Session:   Stress:   . Feeling of Stress :   Social Connections:   . Frequency of Communication with Friends and Family:   . Frequency of Social Gatherings with Friends and Family:   . Attends Religious Services:   . Active Member of Clubs or Organizations:   . Attends Archivist Meetings:   Marland Kitchen Marital Status:   Intimate Partner Violence:   . Fear of Current or Ex-Partner:   . Emotionally Abused:   Marland Kitchen Physically Abused:   . Sexually Abused:     Review of Systems  13 point review of systems per patient health survey noted.  Negative other than as indicated above or in HPI.   Objective:   Vitals:   02/06/20 1412  BP: 116/73  Pulse: 89  Temp: 98.7 F (37.1 C)  TempSrc: Temporal  SpO2: 96%  Weight: 151 lb 9.6 oz (68.8 kg)  Height: '5\' 8"'$  (1.727 m)     Physical Exam Vitals reviewed.  Constitutional:      Appearance: He is well-developed.  HENT:     Head: Normocephalic and atraumatic.     Right Ear: External  ear normal.     Left Ear: External ear normal.  Eyes:     Conjunctiva/sclera: Conjunctivae normal.     Pupils: Pupils are equal, round, and reactive to light.  Neck:     Thyroid: No thyromegaly.  Cardiovascular:     Rate and Rhythm: Normal rate and regular rhythm.  Heart sounds: Normal heart sounds.  Pulmonary:     Effort: Pulmonary effort is normal. No respiratory distress.     Breath sounds: Normal breath sounds. No wheezing.  Abdominal:     General: There is no distension.     Palpations: Abdomen is soft.     Tenderness: There is no abdominal tenderness.  Musculoskeletal:        General: No tenderness. Normal range of motion.     Cervical back: Normal range of motion and neck supple.  Lymphadenopathy:     Cervical: No cervical adenopathy.  Skin:    General: Skin is warm and dry.  Neurological:     Mental Status: He is alert and oriented to person, place, and time.  Psychiatric:        Behavior: Behavior normal.       EKG sinus rhythm, no acute findings, no concerns when compared to 10/19/2019.  Assessment & Plan:  Jesse King is a 54 y.o. male . Annual physical exam  - -anticipatory guidance as below in AVS, screening labs above. Health maintenance items as above in HPI discussed/recommended as applicable.   -He does report previous intolerance to potentially statin medication for hyperlipidemia.  Will check lipids, then can follow-up to discuss what medicine he took previously, and options.  Need for Tdap vaccination - Plan: Tdap vaccine greater than or equal to 7yo IM given  Cigarette nicotine dependence without complication - Plan: varenicline (CHANTIX STARTING MONTH PAK) 0.5 MG X 11 & 1 MG X 42 tablet, varenicline (CHANTIX CONTINUING MONTH PAK) 1 MG tablet, EKG 12-Lead  -Various smoking cessation strategies discussed.  He plans on potentially starting with patches but also considering Chantix.  Prescription was given with potential side effects and risks  discussed.  Handout given on smoking cessation  Difficulty reading due to visual problem - Plan: Ambulatory referral to Ophthalmology   Screening for hyperlipidemia - Plan: CMP14+EGFR, Lipid panel  Encounter for screening for cardiovascular disorders - Plan: EKG 12-Lead  -Questionable/borderline EKG in the ER earlier this year, repeat EKG without concerning findings, asymptomatic.  Screening for diabetes mellitus - Plan: CMP14+EGFR  Erectile dysfunction, unspecified erectile dysfunction type - Plan: sildenafil (VIAGRA) 50 MG tablet  -viagra Rx given - use lowest effective dose. Side effects discussed (including but not limited to headache/flushing, blue discoloration of vision, possible vascular steal and risk of cardiac effects if underlying unknown coronary artery disease, and permanent sensorineural hearing loss). Understanding expressed.  Vaccine counseling  -Covid vaccine discussed and addressed his concerns, discussed information he has read/heard.  He still would like to decline Covid vaccination at this time but links on handout were given for information and advised him to let me know if we can discuss this further or if he has questions.  Meds ordered this encounter  Medications  . varenicline (CHANTIX STARTING MONTH PAK) 0.5 MG X 11 & 1 MG X 42 tablet    Sig: Take one 0.5 mg tablet by mouth once daily for 3 days, then increase to one 0.5 mg tablet twice daily for 4 days, then increase to one 1 mg tablet twice daily.    Dispense:  53 tablet    Refill:  0  . varenicline (CHANTIX CONTINUING MONTH PAK) 1 MG tablet    Sig: Take 1 tablet (1 mg total) by mouth 2 (two) times daily.    Dispense:  60 tablet    Refill:  2  . sildenafil (VIAGRA) 50 MG tablet  Sig: Take 0.5-1 tablets (25-50 mg total) by mouth as needed for erectile dysfunction.    Dispense:  10 tablet    Refill:  0   Patient Instructions   Chantix prescribed for quitting smoking when you are ready unless you want to  try patches first.   I do recommend covid vaccine.  COVID-19 Vaccine Information can be found at: [email or web address] For questions related to vaccine distribution or appointments, please email [email or web address] or call [phone].  Here is a link to information about the COVID-19 vaccine: [email or web address] Please let me know if there are further questions.   Check with your dental insurance to see who is in network.  I will refer you to eye doctor.   Lowest dose that is effective for viagra.   Depending on blood tests, can discuss other options if you had problems with meds in past.   Return to the clinic or go to the nearest emergency room if any of your symptoms worsen or new symptoms occur.   Keeping you healthy  Get these tests  Blood pressure- Have your blood pressure checked once a year by your healthcare provider.  Normal blood pressure is 120/80  Weight- Have your body mass index (BMI) calculated to screen for obesity.  BMI is a measure of body fat based on height and weight. You can also calculate your own BMI at ViewBanking.si.  Cholesterol- Have your cholesterol checked every year.  Diabetes- Have your blood sugar checked regularly if you have high blood pressure, high cholesterol, have a family history of diabetes or if you are overweight.  Screening for Colon Cancer- Colonoscopy starting at age 55.  Screening may begin sooner depending on your family history and other health conditions. Follow up colonoscopy as directed by your Gastroenterologist.  Screening for Prostate Cancer- Both blood work (PSA) and a rectal exam help screen for Prostate Cancer.  Screening begins at age 92 with African-American men and at age 32 with Caucasian men.  Screening may begin sooner depending on your family history.  Take these medicines  Aspirin- One aspirin daily can help prevent Heart  disease and Stroke.  Flu shot- Every fall.  Tetanus- Every 10 years.  Zostavax- Once after the age of 57 to prevent Shingles.  Pneumonia shot- Once after the age of 64; if you are younger than 46, ask your healthcare provider if you need a Pneumonia shot.  Take these steps  Don't smoke- If you do smoke, talk to your doctor about quitting.  For tips on how to quit, go to www.smokefree.gov or call 1-800-QUIT-NOW.  Be physically active- Exercise 5 days a week for at least 30 minutes.  If you are not already physically active start slow and gradually work up to 30 minutes of moderate physical activity.  Examples of moderate activity include walking briskly, mowing the yard, dancing, swimming, bicycling, etc.  Eat a healthy diet- Eat a variety of healthy food such as fruits, vegetables, low fat milk, low fat cheese, yogurt, lean meant, poultry, fish, beans, tofu, etc. For more information go to www.thenutritionsource.org  Drink alcohol in moderation- Limit alcohol intake to less than two drinks a day. Never drink and drive.  Dentist- Brush and floss twice daily; visit your dentist twice a year.  Depression- Your emotional health is as important as your physical health. If you're feeling down, or losing interest in things you would normally enjoy please talk to your healthcare provider.  Eye exam- Visit your eye doctor every year.  Safe  sex- If you may be exposed to a sexually transmitted infection, use a condom.  Seat belts- Seat belts can save your life; always wear one.  Smoke/Carbon Monoxide detectors- These detectors need to be installed on the appropriate level of your home.  Replace batteries at least once a year.  Skin cancer- When out in the sun, cover up and use sunscreen 15 SPF or higher.  Violence- If anyone is threatening you, please tell your healthcare provider.  Living Will/ Health care power of attorney- Speak with your healthcare provider and family.  Steps to Quit  Smoking Smoking tobacco is the leading cause of preventable death. It can affect almost every organ in the body. Smoking puts you and those around you at risk for developing many serious chronic diseases. Quitting smoking can be difficult, but it is one of the best things that you can do for your health. It is never too late to quit. How do I get ready to quit? When you decide to quit smoking, create a plan to help you succeed. Before you quit:  Pick a date to quit. Set a date within the next 2 weeks to give you time to prepare.  Write down the reasons why you are quitting. Keep this list in places where you will see it often.  Tell your family, friends, and co-workers that you are quitting. Support from your loved ones can make quitting easier.  Talk with your health care provider about your options for quitting smoking.  Find out what treatment options are covered by your health insurance.  Identify people, places, things, and activities that make you want to smoke (triggers). Avoid them. What first steps can I take to quit smoking?  Throw away all cigarettes at home, at work, and in your car.  Throw away smoking accessories, such as Scientist, research (medical).  Clean your car. Make sure to empty the ashtray.  Clean your home, including curtains and carpets. What strategies can I use to quit smoking? Talk with your health care provider about combining strategies, such as taking medicines while you are also receiving in-person counseling. Using these two strategies together makes you more likely to succeed in quitting than if you used either strategy on its own.  If you are pregnant or breastfeeding, talk with your health care provider about finding counseling or other support strategies to quit smoking. Do not take medicine to help you quit smoking unless your health care provider tells you to do so. To quit smoking: Quit right away  Quit smoking completely, instead of gradually reducing  how much you smoke over a period of time. Research shows that stopping smoking right away is more successful than gradually quitting.  Attend in-person counseling to help you build problem-solving skills. You are more likely to succeed in quitting if you attend counseling sessions regularly. Even short sessions of 10 minutes can be effective. Take medicine You may take medicines to help you quit smoking. Some medicines require a prescription and some you can purchase over-the-counter. Medicines may have nicotine in them to replace the nicotine in cigarettes. Medicines may:  Help to stop cravings.  Help to relieve withdrawal symptoms. Your health care provider may recommend:  Nicotine patches, gum, or lozenges.  Nicotine inhalers or sprays.  Non-nicotine medicine that is taken by mouth. Find resources Find resources and support systems that can help you to quit smoking and remain smoke-free after you quit. These resources are most helpful when you use them often. They  include:  Online chats with a Social worker.  Telephone quitlines.  Printed Furniture conservator/restorer.  Support groups or group counseling.  Text messaging programs.  Mobile phone apps or applications. Use apps that can help you stick to your quit plan by providing reminders, tips, and encouragement. There are many free apps for mobile devices as well as websites. Examples include Quit Guide from the [employer] and smokefree.gov What things can I do to make it easier to quit?   Reach out to your family and friends for support and encouragement. Call telephone quitlines (1-800-QUIT-NOW), reach out to support groups, or work with a counselor for support.  Ask people who smoke to avoid smoking around you.  Avoid places that trigger you to smoke, such as bars, parties, or smoke-break areas at work.  Spend time with people who do not smoke.  Lessen the stress in your life. Stress can be a smoking trigger for some people. To lessen  stress, try: ? Exercising regularly. ? Doing deep-breathing exercises. ? Doing yoga. ? Meditating. ? Performing a body scan. This involves closing your eyes, scanning your body from head to toe, and noticing which parts of your body are particularly tense. Try to relax the muscles in those areas. How will I feel when I quit smoking? Day 1 to 3 weeks Within the first 24 hours of quitting smoking, you may start to feel withdrawal symptoms. These symptoms are usually most noticeable 2-3 days after quitting, but they usually do not last for more than 2-3 weeks. You may experience these symptoms:  Mood swings.  Restlessness, anxiety, or irritability.  Trouble concentrating.  Dizziness.  Strong cravings for sugary foods and nicotine.  Mild weight gain.  Constipation.  Nausea.  Coughing or a sore throat.  Changes in how the medicines that you take for unrelated issues work in your body.  Depression.  Trouble sleeping (insomnia). Week 3 and afterward After the first 2-3 weeks of quitting, you may start to notice more positive results, such as:  Improved sense of smell and taste.  Decreased coughing and sore throat.  Slower heart rate.  Lower blood pressure.  Clearer skin.  The ability to breathe more easily.  Fewer sick days. Quitting smoking can be very challenging. Do not get discouraged if you are not successful the first time. Some people need to make many attempts to quit before they achieve long-term success. Do your best to stick to your quit plan, and talk with your health care provider if you have any questions or concerns. Summary  Smoking tobacco is the leading cause of preventable death. Quitting smoking is one of the best things that you can do for your health.  When you decide to quit smoking, create a plan to help you succeed.  Quit smoking right away, not slowly over a period of time.  When you start quitting, seek help from your health care provider,  family, or friends. This information is not intended to replace advice given to you by your health care provider. Make sure you discuss any questions you have with your health care provider. Document Revised: 05/25/2019 Document Reviewed: 11/18/2018 Elsevier Patient Education  [employer].   If you have lab work done today you will be contacted with your lab results within the next 2 weeks.  If you have not heard from [place] then please contact us. The fastest way to get your results is to register for My Chart.   IF you received an x-ray today, you  will receive an invoice from [facility]. Please contact [facility] at [phone] with questions or concerns regarding your invoice.   IF you received labwork today, you will receive an invoice from [facility]. Please contact [facility] at [phone] with questions or concerns regarding your invoice.   Our billing staff will not be able to assist you with questions regarding bills from these companies.  You will be contacted with the lab results as soon as they are available. The fastest way to get your results is to activate your My Chart account. Instructions are located on the last page of this paperwork. If you have not heard from [place] regarding the results in 2 weeks, please contact this office.         Signed, Merri Ray, MD [facility]

## 2020-02-06 NOTE — Patient Instructions (Addendum)
Chantix prescribed for quitting smoking when you are ready unless you want to try patches first.   I do recommend covid vaccine.  COVID-19 Vaccine Information can be found at: ShippingScam.co.uk For questions related to vaccine distribution or appointments, please email [email or web address] or call [phone].  Here is a link to information about the COVID-19 vaccine: [email or web address] Please let me know if there are further questions.   Check with your dental insurance to see who is in network.  I will refer you to eye doctor.   Lowest dose that is effective for viagra.   Depending on blood tests, can discuss other options if you had problems with meds in past.   Return to the clinic or go to the nearest emergency room if any of your symptoms worsen or new symptoms occur.   Keeping you healthy  Get these tests  Blood pressure- Have your blood pressure checked once a year by your healthcare provider.  Normal blood pressure is 120/80  Weight- Have your body mass index (BMI) calculated to screen for obesity.  BMI is a measure of body fat based on height and weight. You can also calculate your own BMI at ViewBanking.[email or web address].  Cholesterol- Have your cholesterol checked every year.  Diabetes- Have your blood sugar checked regularly if you have high blood pressure, high cholesterol, have a family history of diabetes or if you are overweight.  Screening for Colon Cancer- Colonoscopy starting at age 83.  Screening may begin sooner depending on your family history and other health conditions. Follow up colonoscopy as directed by your Gastroenterologist.  Screening for Prostate Cancer- Both blood work (PSA) and a rectal exam help screen for Prostate Cancer.  Screening begins at age 43 with African-American men and at age 5 with Caucasian men.  Screening may begin sooner depending on your family  history.  Take these medicines  Aspirin- One aspirin daily can help prevent Heart disease and Stroke.  Flu shot- Every fall.  Tetanus- Every 10 years.  Zostavax- Once after the age of 19 to prevent Shingles.  Pneumonia shot- Once after the age of 80; if you are younger than 93, ask your healthcare provider if you need a Pneumonia shot.  Take these steps  Don't smoke- If you do smoke, talk to your doctor about quitting.  For tips on how to quit, go to www.smokefree.gov or call 1-800-QUIT-NOW.  Be physically active- Exercise 5 days a week for at least 30 minutes.  If you are not already physically active start slow and gradually work up to 30 minutes of moderate physical activity.  Examples of moderate activity include walking briskly, mowing the yard, dancing, swimming, bicycling, etc.  Eat a healthy diet- Eat a variety of healthy food such as fruits, vegetables, low fat milk, low fat cheese, yogurt, lean meant, poultry, fish, beans, tofu, etc. For more information go to www.thenutritionsource.org  Drink alcohol in moderation- Limit alcohol intake to less than two drinks a day. Never drink and drive.  Dentist- Brush and floss twice daily; visit your dentist twice a year.  Depression- Your emotional health is as important as your physical health. If you're feeling down, or losing interest in things you would normally enjoy please talk to your healthcare provider.  Eye exam- Visit your eye doctor every year.  Safe sex- If you may be exposed to a sexually transmitted infection, use a condom.  Seat belts- Seat belts can save your life; always wear one.  Smoke/Carbon Monoxide detectors- These detectors need to  be installed on the appropriate level of your home.  Replace batteries at least once a year.  Skin cancer- When out in the sun, cover up and use sunscreen 15 SPF or higher.  Violence- If anyone is threatening you, please tell your healthcare provider.  Living Will/ Health care  power of attorney- Speak with your healthcare provider and family.  Steps to Quit Smoking Smoking tobacco is the leading cause of preventable death. It can affect almost every organ in the body. Smoking puts you and those around you at risk for developing many serious chronic diseases. Quitting smoking can be difficult, but it is one of the best things that you can do for your health. It is never too late to quit. How do I get ready to quit? When you decide to quit smoking, create a plan to help you succeed. Before you quit:  Pick a date to quit. Set a date within the next 2 weeks to give you time to prepare.  Write down the reasons why you are quitting. Keep this list in places where you will see it often.  Tell your family, friends, and co-workers that you are quitting. Support from your loved ones can make quitting easier.  Talk with your health care provider about your options for quitting smoking.  Find out what treatment options are covered by your health insurance.  Identify people, places, things, and activities that make you want to smoke (triggers). Avoid them. What first steps can I take to quit smoking?  Throw away all cigarettes at home, at work, and in your car.  Throw away smoking accessories, such as Scientist, research (medical).  Clean your car. Make sure to empty the ashtray.  Clean your home, including curtains and carpets. What strategies can I use to quit smoking? Talk with your health care provider about combining strategies, such as taking medicines while you are also receiving in-person counseling. Using these two strategies together makes you more likely to succeed in quitting than if you used either strategy on its own.  If you are pregnant or breastfeeding, talk with your health care provider about finding counseling or other support strategies to quit smoking. Do not take medicine to help you quit smoking unless your health care provider tells you to do so. To quit  smoking: Quit right away  Quit smoking completely, instead of gradually reducing how much you smoke over a period of time. Research shows that stopping smoking right away is more successful than gradually quitting.  Attend in-person counseling to help you build problem-solving skills. You are more likely to succeed in quitting if you attend counseling sessions regularly. Even short sessions of 10 minutes can be effective. Take medicine You may take medicines to help you quit smoking. Some medicines require a prescription and some you can purchase over-the-counter. Medicines may have nicotine in them to replace the nicotine in cigarettes. Medicines may:  Help to stop cravings.  Help to relieve withdrawal symptoms. Your health care provider may recommend:  Nicotine patches, gum, or lozenges.  Nicotine inhalers or sprays.  Non-nicotine medicine that is taken by mouth. Find resources Find resources and support systems that can help you to quit smoking and remain smoke-free after you quit. These resources are most helpful when you use them often. They include:  Online chats with a Social worker.  Telephone quitlines.  Printed Furniture conservator/restorer.  Support groups or group counseling.  Text messaging programs.  Mobile phone apps or applications. Use apps that can  help you stick to your quit plan by providing reminders, tips, and encouragement. There are many free apps for mobile devices as well as websites. Examples include Quit Guide from the [employer] and smokefree.gov What things can I do to make it easier to quit?   Reach out to your family and friends for support and encouragement. Call telephone quitlines (1-800-QUIT-NOW), reach out to support groups, or work with a counselor for support.  Ask people who smoke to avoid smoking around you.  Avoid places that trigger you to smoke, such as bars, parties, or smoke-break areas at work.  Spend time with people who do not smoke.  Lessen the  stress in your life. Stress can be a smoking trigger for some people. To lessen stress, try: ? Exercising regularly. ? Doing deep-breathing exercises. ? Doing yoga. ? Meditating. ? Performing a body scan. This involves closing your eyes, scanning your body from head to toe, and noticing which parts of your body are particularly tense. Try to relax the muscles in those areas. How will I feel when I quit smoking? Day 1 to 3 weeks Within the first 24 hours of quitting smoking, you may start to feel withdrawal symptoms. These symptoms are usually most noticeable 2-3 days after quitting, but they usually do not last for more than 2-3 weeks. You may experience these symptoms:  Mood swings.  Restlessness, anxiety, or irritability.  Trouble concentrating.  Dizziness.  Strong cravings for sugary foods and nicotine.  Mild weight gain.  Constipation.  Nausea.  Coughing or a sore throat.  Changes in how the medicines that you take for unrelated issues work in your body.  Depression.  Trouble sleeping (insomnia). Week 3 and afterward After the first 2-3 weeks of quitting, you may start to notice more positive results, such as:  Improved sense of smell and taste.  Decreased coughing and sore throat.  Slower heart rate.  Lower blood pressure.  Clearer skin.  The ability to breathe more easily.  Fewer sick days. Quitting smoking can be very challenging. Do not get discouraged if you are not successful the first time. Some people need to make many attempts to quit before they achieve long-term success. Do your best to stick to your quit plan, and talk with your health care provider if you have any questions or concerns. Summary  Smoking tobacco is the leading cause of preventable death. Quitting smoking is one of the best things that you can do for your health.  When you decide to quit smoking, create a plan to help you succeed.  Quit smoking right away, not slowly over a  period of time.  When you start quitting, seek help from your health care provider, family, or friends. This information is not intended to replace advice given to you by your health care provider. Make sure you discuss any questions you have with your health care provider. Document Revised: 05/25/2019 Document Reviewed: 11/18/2018 Elsevier Patient Education  [employer].   If you have lab work done today you will be contacted with your lab results within the next 2 weeks.  If you have not heard from [place] then please contact us. The fastest way to get your results is to register for My Chart.   IF you received an x-ray today, you will receive an invoice from [facility]. Please contact [facility] at [phone] with questions or concerns regarding your invoice.   IF you received labwork today, you will receive an invoice from [employer].  Please contact [facility] at [phone] with questions or concerns regarding your invoice.   Our billing staff will not be able to assist you with questions regarding bills from these companies.  You will be contacted with the lab results as soon as they are available. The fastest way to get your results is to activate your My Chart account. Instructions are located on the last page of this paperwork. If you have not heard from [place] regarding the results in 2 weeks, please contact this office.

## 2020-02-07 ENCOUNTER — Encounter: Payer: Self-pay | Admitting: Family Medicine

## 2020-02-07 LAB — CMP14+EGFR
ALT: 8 IU/L (ref 0–44)
AST: 11 IU/L (ref 0–40)
Albumin/Globulin Ratio: 2.3 — ABNORMAL HIGH (ref 1.2–2.2)
Albumin: 4.9 g/dL (ref 3.8–4.9)
Alkaline Phosphatase: 73 IU/L (ref 48–121)
BUN/Creatinine Ratio: 16 (ref 9–20)
BUN: 15 mg/dL (ref 6–24)
Bilirubin Total: 0.5 mg/dL (ref 0.0–1.2)
CO2: 24 mmol/L (ref 20–29)
Calcium: 10 mg/dL (ref 8.7–10.2)
Chloride: 106 mmol/L (ref 96–106)
Creatinine, Ser: 0.92 mg/dL (ref 0.76–1.27)
GFR calc Af Amer: 108 mL/min/{1.73_m2} (ref >59)
GFR calc non Af Amer: 93 mL/min/{1.73_m2} (ref >59)
Globulin, Total: 2.1 g/dL (ref 1.5–4.5)
Glucose: 88 mg/dL (ref 65–99)
Potassium: 4.3 mmol/L (ref 3.5–5.2)
Sodium: 143 mmol/L (ref 134–144)
Total Protein: 7 g/dL (ref 6.0–8.5)

## 2020-02-07 LAB — LIPID PANEL
Chol/HDL Ratio: 5.4 ratio — ABNORMAL HIGH (ref 0.0–5.0)
Cholesterol, Total: 201 mg/dL — ABNORMAL HIGH (ref 100–199)
HDL: 37 mg/dL — ABNORMAL LOW (ref >39)
LDL Chol Calc (NIH): 144 mg/dL — ABNORMAL HIGH (ref 0–99)
Triglycerides: 111 mg/dL (ref 0–149)
VLDL Cholesterol Cal: 20 mg/dL (ref 5–40)

## 2020-03-12 ENCOUNTER — Ambulatory Visit: Payer: BC Managed Care – PPO | Admitting: Family Medicine

## 2020-03-12 ENCOUNTER — Ambulatory Visit (INDEPENDENT_AMBULATORY_CARE_PROVIDER_SITE_OTHER): Payer: BC Managed Care – PPO

## 2020-03-12 ENCOUNTER — Encounter: Payer: Self-pay | Admitting: Family Medicine

## 2020-03-12 ENCOUNTER — Other Ambulatory Visit: Payer: Self-pay

## 2020-03-12 VITALS — BP 121/70 | HR 82 | Temp 98.2°F | Resp 15 | Ht 68.0 in | Wt 145.8 lb

## 2020-03-12 DIAGNOSIS — F17210 Nicotine dependence, cigarettes, uncomplicated: Secondary | ICD-10-CM

## 2020-03-12 DIAGNOSIS — M25562 Pain in left knee: Secondary | ICD-10-CM | POA: Diagnosis not present

## 2020-03-12 DIAGNOSIS — E785 Hyperlipidemia, unspecified: Secondary | ICD-10-CM | POA: Diagnosis not present

## 2020-03-12 NOTE — Patient Instructions (Addendum)
1-800-quit-now may be able to provide helpful hints on quitting smoking. Let me know if I can help further.   Please bring prior medicine to the office or send [place] a picture on Mychart so we can discuss a different option to treat your high cholesterol.   I will check xray of left knee and let you know if concerns. You may have a component of arthritis. Tylenol on occasion. Knee brace may help. If continued pain, I am happy to refer you orthopaedics.   Return to the clinic or go to the nearest emergency room if any of your symptoms worsen or new symptoms occur.   High Cholesterol  High cholesterol is a condition in which the blood has high levels of a white, waxy, fat-like substance (cholesterol). The human body needs small amounts of cholesterol. The liver makes all the cholesterol that the body needs. Extra (excess) cholesterol comes from the food that we eat. Cholesterol is carried from the liver by the blood through the blood vessels. If you have high cholesterol, deposits (plaques) may build up on the walls of your blood vessels (arteries). Plaques make the arteries narrower and stiffer. Cholesterol plaques increase your risk for heart attack and stroke. Work with your health care provider to keep your cholesterol levels in a healthy range. What increases the risk? This condition is more likely to develop in people who:  Eat foods that are high in animal fat (saturated fat) or cholesterol.  Are overweight.  Are not getting enough exercise.  Have a family history of high cholesterol. What are the signs or symptoms? There are no symptoms of this condition. How is this diagnosed? This condition may be diagnosed from the results of a blood test.  If you are older than age 75, your health care provider may check your cholesterol every 4-6 years.  You may be checked more often if you already have high cholesterol or other risk factors for heart disease. The blood test for cholesterol  measures:  "Bad" cholesterol (LDL cholesterol). This is the main type of cholesterol that causes heart disease. The desired level for LDL is less than 100.  "Good" cholesterol (HDL cholesterol). This type helps to protect against heart disease by cleaning the arteries and carrying the LDL away. The desired level for HDL is 60 or higher.  Triglycerides. These are fats that the body can store or burn for energy. The desired number for triglycerides is lower than 150.  Total cholesterol. This is a measure of the total amount of cholesterol in your blood, including LDL cholesterol, HDL cholesterol, and triglycerides. A healthy number is less than 200. How is this treated? This condition is treated with diet changes, lifestyle changes, and medicines. Diet changes  This may include eating more whole grains, fruits, vegetables, nuts, and fish.  This may also include cutting back on red meat and foods that have a lot of added sugar. Lifestyle changes  Changes may include getting at least 40 minutes of aerobic exercise 3 times a week. Aerobic exercises include walking, biking, and swimming. Aerobic exercise along with a healthy diet can help you maintain a healthy weight.  Changes may also include quitting smoking. Medicines  Medicines are usually given if diet and lifestyle changes have failed to reduce your cholesterol to healthy levels.  Your health care provider may prescribe a statin medicine. Statin medicines have been shown to reduce cholesterol, which can reduce the risk of heart disease. Follow these instructions at home: Eating and  drinking If told by your health care provider:  Eat chicken (without skin), fish, veal, shellfish, ground [place] breast, and round or loin cuts of red meat.  Do not eat fried foods or fatty meats, such as hot dogs and salami.  Eat plenty of fruits, such as apples.  Eat plenty of vegetables, such as broccoli, potatoes, and carrots.  Eat beans, peas,  and lentils.  Eat grains such as barley, rice, couscous, and bulgur wheat.  Eat pasta without cream sauces.  Use skim or nonfat milk, and eat low-fat or nonfat yogurt and cheeses.  Do not eat or drink whole milk, cream, ice cream, egg yolks, or hard cheeses.  Do not eat stick margarine or tub margarines that contain trans fats (also called partially hydrogenated oils).  Do not eat saturated tropical oils, such as coconut oil and palm oil.  Do not eat cakes, cookies, crackers, or other baked goods that contain trans fats.  General instructions  Exercise as directed by your health care provider. Increase your activity level with activities such as gardening, walking, and taking the stairs.  Take over-the-counter and prescription medicines only as told by your health care provider.  Do not use any products that contain nicotine or tobacco, such as cigarettes and e-cigarettes. If you need help quitting, ask your health care provider.  Keep all follow-up visits as told by your health care provider. This is important. Contact a health care provider if:  You are struggling to maintain a healthy diet or weight.  You need help to start on an exercise program.  You need help to stop smoking. Get help right away if:  You have chest pain.  You have trouble breathing. This information is not intended to replace advice given to you by your health care provider. Make sure you discuss any questions you have with your health care provider. Document Revised: 09/02/2017 Document Reviewed: 02/28/2016 Elsevier Patient Education  [employer].   If you have lab work done today you will be contacted with your lab results within the next 2 weeks.  If you have not heard from [place] then please contact us. The fastest way to get your results is to register for My Chart.   IF you received an x-ray today, you will receive an invoice from [facility] Radiology. Please contact [facility] Radiology at  [phone] with questions or concerns regarding your invoice.   IF you received labwork today, you will receive an invoice from [place]. Please contact [facility] at [phone] with questions or concerns regarding your invoice.   Our billing staff will not be able to assist you with questions regarding bills from these companies.  You will be contacted with the lab results as soon as they are available. The fastest way to get your results is to activate your My Chart account. Instructions are located on the last page of this paperwork. If you have not heard from [place] regarding the results in 2 weeks, please contact this office.

## 2020-03-12 NOTE — Progress Notes (Signed)
Subjective:  Patient ID: Jesse King, male    DOB: 11-10-1965  Age: 54 y.o. MRN: [id number]  CC:  Chief Complaint  Patient presents with  . Nicotine Dependence    pt has not yet started the chantix Rx states he still has the Rx itself but has not filled this, pt has started cutting back on cigarettes per day, otherwise pt would like to review lab work.    . Varicose Veins    pt has had some veins on bot legs that he notes have been there for several years, pt states they do not hurt but wonders if they could be related to his Lt Knee pain.     HPI Burke SHELLHAMMER presents for  Follow-up from May 26 physical.  Nicotine dependence: Chantix prescribed last visit.  Option of Chantix versus nicotine patches.  Has not started.   Varicose Veins: Present for years. No pain in veins. No recent changes.   L knee pain Past 6 months.  Prior arthroscopic surgery in ealry 1990's - cartilage issue.  Occasional pain prior to 6 months ago. Worse past 6 months. Giving way at times.- buckled in waves last week. Occasionally swollen.  Treatment: none, no meds or bracing, no relief with ibuprofen.   Hyperlipidemia: Not currently on statin.  Did mention last visit that he may have had intolerance to one medication.  No previous statin noted on EMR. No regular fried food/fast food.  One med in past caused headache - he does not remember name but has at home. Taking niacin every day.   The 10-year ASCVD risk score Mikey Bussing DC Brooke Bonito., et al., 2013) is: 12.3%   Values used to calculate the score:     Age: 11 years     Sex: Male     Is Non-Hispanic African American: No     Diabetic: No     Tobacco smoker: Yes     Systolic Blood Pressure: 378 mmHg     Is BP treated: No     HDL Cholesterol: 37 mg/dL     Total Cholesterol: 201 mg/dL  Lab Results  Component Value Date   CHOL 201 (H) 02/06/2020   HDL 37 (L) 02/06/2020   LDLCALC 144 (H) 02/06/2020   TRIG 111 02/06/2020   CHOLHDL 5.4 (H) 02/06/2020    Lab Results  Component Value Date   ALT 8 02/06/2020   AST 11 02/06/2020   ALKPHOS 73 02/06/2020   BILITOT 0.5 02/06/2020      History Patient Active Problem List   Diagnosis Date Noted  . Nicotine addiction 08/16/2016  . Tobacco use disorder 08/16/2016   Past Medical History:  Diagnosis Date  . Chronic back pain    muscle spasms  . Fatty liver    Per CT scan   . GERD (gastroesophageal reflux disease)    Past Surgical History:  Procedure Laterality Date  . APPENDECTOMY    . BACK SURGERY    . KNEE SURGERY    . WISDOM TOOTH EXTRACTION     Allergies  Allergen Reactions  . Codeine Hives and Nausea And Vomiting  . Tamiflu [Oseltamivir] Hives  . Amoxicillin Nausea And Vomiting and Rash   Prior to Admission medications   Medication Sig Start Date End Date Taking? Authorizing Provider  ascorbic acid (VITAMIN C) 500 MG tablet Take 500 mg by mouth daily.   Yes [provider]  BIOTIN PO Take 1 tablet by mouth daily.   Yes [provider]  Cholecalciferol (VITAMIN D3 PO) Take 1 tablet by mouth daily.   Yes [provider]  Cyanocobalamin (VITAMIN B-12 PO) Take 1 tablet by mouth daily.   Yes [provider]  ibuprofen (ADVIL,MOTRIN) 800 MG tablet Take 1 tablet (800 mg total) by mouth every 8 (eight) hours as needed. 02/15/17  Yes Long, [name], MD  methocarbamol (ROBAXIN) 500 MG tablet Take 1 tablet (500 mg total) by mouth every 8 (eight) hours as needed. 10/19/19  Yes [name], Samantha R, PA-C  naproxen (NAPROSYN) 500 MG tablet Take 1 tablet (500 mg total) by mouth 2 (two) times daily. 10/19/19  Yes [name], Samantha R, PA-C  sildenafil (VIAGRA) 50 MG tablet Take 0.5-1 tablets (25-50 mg total) by mouth as needed for erectile dysfunction. 02/06/20  Yes Wendie Agreste, MD  traMADol (ULTRAM) 50 MG tablet Take 1 tablet (50 mg total) by mouth every 6 (six) hours as needed. 02/15/17  Yes Long, [name], MD  varenicline (CHANTIX CONTINUING MONTH  PAK) 1 MG tablet Take 1 tablet (1 mg total) by mouth 2 (two) times daily. 02/06/20  Yes Wendie Agreste, MD  varenicline (CHANTIX STARTING MONTH PAK) 0.5 MG X 11 & 1 MG X 42 tablet Take one 0.5 mg tablet by mouth once daily for 3 days, then increase to one 0.5 mg tablet twice daily for 4 days, then increase to one 1 mg tablet twice daily. 02/06/20  Yes Wendie Agreste, MD  VITAMIN E PO Take 1 tablet by mouth daily.   Yes [provider]  omeprazole (PRILOSEC) 20 MG capsule Take 1 capsule (20 mg total) by mouth daily. Patient not taking: Reported on 10/19/2019 10/10/17 10/19/19  Joretta Bachelor, PA   Social History   Socioeconomic History  . Marital status: Divorced    Spouse name: Not on file  . Number of children: 0  . Years of education: Not on file  . Highest education level: Not on file  Occupational History  . Not on file  Tobacco Use  . Smoking status: Current Every Day Smoker    Packs/day: 0.50    Years: 30.00    Pack years: 15.00    Types: Cigarettes  . Smokeless tobacco: Never Used  . Tobacco comment: "not quite a pack per day"  Vaping Use  . Vaping Use: Former  Substance and Sexual Activity  . Alcohol use: Not Currently    Alcohol/week: 0.0 standard drinks    Comment: quit drinking 3 years ago  . Drug use: No  . Sexual activity: Not on file  Other Topics Concern  . Not on file  Social History Narrative  . Not on file   Social Determinants of Health   Financial Resource Strain:   . Difficulty of Paying Living Expenses:   Food Insecurity:   . Worried About Charity fundraiser in the Last Year:   . Arboriculturist in the Last Year:   Transportation Needs:   . Film/video editor (Medical):   Marland Kitchen Lack of Transportation (Non-Medical):   Physical Activity:   . Days of Exercise per Week:   . Minutes of Exercise per Session:   Stress:   . Feeling of Stress :   Social Connections:   . Frequency of Communication with Friends and Family:   . Frequency  of Social Gatherings with Friends and Family:   . Attends Religious Services:   . Active Member of Clubs or Organizations:   . Attends  Club or Organization Meetings:   Marland Kitchen Marital Status:   Intimate Partner Violence:   . Fear of Current or Ex-Partner:   . Emotionally Abused:   Marland Kitchen Physically Abused:   . Sexually Abused:     Review of Systems Per HPI  Objective:   Vitals:   03/12/20 1551  BP: 121/70  Pulse: 82  Resp: 15  Temp: 98.2 F (36.8 C)  TempSrc: Temporal  SpO2: 97%  Weight: 145 lb 12.8 oz (66.1 kg)  Height: 5\' 8"  (1.727 m)    Physical Exam Constitutional:      General: He is not in acute distress.    Appearance: He is well-developed.  HENT:     Head: Normocephalic and atraumatic.  Cardiovascular:     Rate and Rhythm: Normal rate.  Pulmonary:     Effort: Pulmonary effort is normal.  Musculoskeletal:     Comments: Left leg, few spider/varicose veins of left thigh, nontender.  No overlying warmth or erythema.  Left knee full range of motion, no effusion, skin intact, no erythema. Slight tenderness over the medial joint line, negative varus/valgus stress.  Minimal discomfort with McMurray, medial aspect only.  Neurovascular intact distally.  Calves nontender.  Neurological:     Mental Status: He is alert and oriented to person, place, and time.    DG Knee Complete 4 Views Left  Result Date: 03/12/2020 CLINICAL DATA:  Left knee pain EXAM: LEFT KNEE - COMPLETE 4+ VIEW COMPARISON:  None. FINDINGS: No evidence of fracture, dislocation, or joint effusion. No evidence of arthropathy or other focal bone abnormality. Soft tissues are unremarkable. IMPRESSION: Negative. Electronically Signed   By: Franchot Gallo M.D.   On: 03/12/2020 17:17      Assessment & Plan:  ANDRICK RUST is a 54 y.o. male . Left medial knee pain - Plan: DG Knee Complete 4 Views Left, Apply knee sleeve  -Reassuring exam, imaging, some medial discomfort.  Possible meniscal pathology but no  effusion at this time.  Trial of knee brace initially, and then orthopedic evaluation if not improving.  Episodic Tylenol if needed.  RTC precautions.  Hyperlipidemia, unspecified hyperlipidemia type  -Elevated ASCVD risk score, questionable previous medicine that he is not tolerated.  He will check into that medicine and let me know.  Okay to continue niacin for now.  Cigarette nicotine dependence without complication  -Still planning on cessation, has Chantix when ready.  1 800 quit NOW discussed for other assistance when quitting and handout given last visit.  Counseling again regarding COVID-19 vaccine.  CDC.gov for more information.  No orders of the defined types were placed in this encounter.  Patient Instructions    1-800-quit-now may be able to provide helpful hints on quitting smoking. Let me know if I can help further.   Please bring prior medicine to the office or send [place] a picture on Mychart so we can discuss a different option to treat your high cholesterol.   I will check xray of left knee and let you know if concerns. You may have a component of arthritis. Tylenol on occasion. Knee brace may help. If continued pain, I am happy to refer you orthopaedics.   Return to the clinic or go to the nearest emergency room if any of your symptoms worsen or new symptoms occur.   High Cholesterol  High cholesterol is a condition in which the blood has high levels of a white, waxy, fat-like substance (cholesterol). The human body needs small amounts of  cholesterol. The liver makes all the cholesterol that the body needs. Extra (excess) cholesterol comes from the food that we eat. Cholesterol is carried from the liver by the blood through the blood vessels. If you have high cholesterol, deposits (plaques) may build up on the walls of your blood vessels (arteries). Plaques make the arteries narrower and stiffer. Cholesterol plaques increase your risk for heart attack and stroke. Work with  your health care provider to keep your cholesterol levels in a healthy range. What increases the risk? This condition is more likely to develop in people who:  Eat foods that are high in animal fat (saturated fat) or cholesterol.  Are overweight.  Are not getting enough exercise.  Have a family history of high cholesterol. What are the signs or symptoms? There are no symptoms of this condition. How is this diagnosed? This condition may be diagnosed from the results of a blood test.  If you are older than age 71, your health care provider may check your cholesterol every 4-6 years.  You may be checked more often if you already have high cholesterol or other risk factors for heart disease. The blood test for cholesterol measures:  "Bad" cholesterol (LDL cholesterol). This is the main type of cholesterol that causes heart disease. The desired level for LDL is less than 100.  "Good" cholesterol (HDL cholesterol). This type helps to protect against heart disease by cleaning the arteries and carrying the LDL away. The desired level for HDL is 60 or higher.  Triglycerides. These are fats that the body can store or burn for energy. The desired number for triglycerides is lower than 150.  Total cholesterol. This is a measure of the total amount of cholesterol in your blood, including LDL cholesterol, HDL cholesterol, and triglycerides. A healthy number is less than 200. How is this treated? This condition is treated with diet changes, lifestyle changes, and medicines. Diet changes  This may include eating more whole grains, fruits, vegetables, nuts, and fish.  This may also include cutting back on red meat and foods that have a lot of added sugar. Lifestyle changes  Changes may include getting at least 40 minutes of aerobic exercise 3 times a week. Aerobic exercises include walking, biking, and swimming. Aerobic exercise along with a healthy diet can help you maintain a healthy  weight.  Changes may also include quitting smoking. Medicines  Medicines are usually given if diet and lifestyle changes have failed to reduce your cholesterol to healthy levels.  Your health care provider may prescribe a statin medicine. Statin medicines have been shown to reduce cholesterol, which can reduce the risk of heart disease. Follow these instructions at home: Eating and drinking If told by your health care provider:  Eat chicken (without skin), fish, veal, shellfish, ground [place] breast, and round or loin cuts of red meat.  Do not eat fried foods or fatty meats, such as hot dogs and salami.  Eat plenty of fruits, such as apples.  Eat plenty of vegetables, such as broccoli, potatoes, and carrots.  Eat beans, peas, and lentils.  Eat grains such as barley, rice, couscous, and bulgur wheat.  Eat pasta without cream sauces.  Use skim or nonfat milk, and eat low-fat or nonfat yogurt and cheeses.  Do not eat or drink whole milk, cream, ice cream, egg yolks, or hard cheeses.  Do not eat stick margarine or tub margarines that contain trans fats (also called partially hydrogenated oils).  Do not eat saturated tropical oils,  such as coconut oil and palm oil.  Do not eat cakes, cookies, crackers, or other baked goods that contain trans fats.  General instructions  Exercise as directed by your health care provider. Increase your activity level with activities such as gardening, walking, and taking the stairs.  Take over-the-counter and prescription medicines only as told by your health care provider.  Do not use any products that contain nicotine or tobacco, such as cigarettes and e-cigarettes. If you need help quitting, ask your health care provider.  Keep all follow-up visits as told by your health care provider. This is important. Contact a health care provider if:  You are struggling to maintain a healthy diet or weight.  You need help to start on an exercise  program.  You need help to stop smoking. Get help right away if:  You have chest pain.  You have trouble breathing. This information is not intended to replace advice given to you by your health care provider. Make sure you discuss any questions you have with your health care provider. Document Revised: 09/02/2017 Document Reviewed: 02/28/2016 Elsevier Patient Education  [employer].   If you have lab work done today you will be contacted with your lab results within the next 2 weeks.  If you have not heard from [place] then please contact us. The fastest way to get your results is to register for My Chart.   IF you received an x-ray today, you will receive an invoice from [facility]. Please contact [facility] at [phone] with questions or concerns regarding your invoice.   IF you received labwork today, you will receive an invoice from [place]. Please contact [facility] at [phone] with questions or concerns regarding your invoice.   Our billing staff will not be able to assist you with questions regarding bills from these companies.  You will be contacted with the lab results as soon as they are available. The fastest way to get your results is to activate your My Chart account. Instructions are located on the last page of this paperwork. If you have not heard from [place] regarding the results in 2 weeks, please contact this office.         Signed, Merri Ray, MD [facility]

## 2020-03-19 ENCOUNTER — Telehealth: Payer: Self-pay | Admitting: Family Medicine

## 2020-03-19 NOTE — Telephone Encounter (Signed)
Referral Followup °

## 2020-04-07 ENCOUNTER — Other Ambulatory Visit: Payer: Self-pay

## 2020-04-07 ENCOUNTER — Telehealth (INDEPENDENT_AMBULATORY_CARE_PROVIDER_SITE_OTHER): Payer: BC Managed Care – PPO | Admitting: Family Medicine

## 2020-04-07 DIAGNOSIS — M62830 Muscle spasm of back: Secondary | ICD-10-CM | POA: Diagnosis not present

## 2020-04-07 DIAGNOSIS — M549 Dorsalgia, unspecified: Secondary | ICD-10-CM

## 2020-04-07 MED ORDER — PREDNISONE 20 MG PO TABS: 40.0000 mg | ORAL_TABLET | Freq: Every day | ORAL | 0 refills | Status: AC

## 2020-04-07 MED ORDER — CYCLOBENZAPRINE HCL 5 MG PO TABS: ORAL_TABLET | ORAL | 0 refills | Status: AC

## 2020-04-07 NOTE — Progress Notes (Signed)
Virtual Visit via phone note  I connected with Jesse King on 04/07/20 at [place] PM by phone. Failed connection by 2 separate video platforms - caregility and doximity.  verified that I am speaking with the correct person using two identifiers.  Patient location:home  My location: office   I discussed the limitations, risks, security and privacy concerns of performing an evaluation and management service by telephone and the availability of in person appointments. I also discussed with the patient that there may be a patient responsible charge related to this service. The patient expressed understanding and agreed to proceed, consent obtained  Chief complaint:  Chief Complaint  Patient presents with  . Shoulder Pain    pt thinks he has pulled a muscle in his shoulder, shoulder feels swollen, pt also has a lot of pain, pt played golf sunday last week, ibuprofen helps but no lasting relief     History of Present Illness: Jesse King is a 54 y.o. male   R shoulder blade pain: Started few days ago.  initially soreness in lower back last week, but that improved. Shoulder blade soreness has persisted. Sore to move certain ways. May have twisted the wrong way when playing golf last Sunday. No initial pain, but noticed soreness next day - R low back, R shoulder blade. Other areas improved except shoulder blade. No neck pain, but pulling sensation into R shoulder blade if turning neck left.  No pain/numbness/tingling into arms/hands.  Tx: ibuprofen 800mg  at a time - every 8 hours. Helps some.  Tried leftover robaxin once with this pain - no relief.   Patient Active Problem List   Diagnosis Date Noted  . Nicotine addiction 08/16/2016  . Tobacco use disorder 08/16/2016   Past Medical History:  Diagnosis Date  . Chronic back pain    muscle spasms  . Fatty liver    Per CT scan   . GERD (gastroesophageal reflux disease)    Past Surgical History:  Procedure Laterality Date  .  APPENDECTOMY    . BACK SURGERY    . KNEE SURGERY    . WISDOM TOOTH EXTRACTION     Allergies  Allergen Reactions  . Codeine Hives and Nausea And Vomiting  . Tamiflu [Oseltamivir] Hives  . Amoxicillin Nausea And Vomiting and Rash   Prior to Admission medications   Medication Sig Start Date End Date Taking? Authorizing Provider  ascorbic acid (VITAMIN C) 500 MG tablet Take 500 mg by mouth daily.   Yes [provider]  BIOTIN PO Take 1 tablet by mouth daily.   Yes [provider]  Cholecalciferol (VITAMIN D3 PO) Take 1 tablet by mouth daily.   Yes [provider]  Cyanocobalamin (VITAMIN B-12 PO) Take 1 tablet by mouth daily.   Yes [provider]  ibuprofen (ADVIL,MOTRIN) 800 MG tablet Take 1 tablet (800 mg total) by mouth every 8 (eight) hours as needed. 02/15/17  Yes Long, [name], MD  VITAMIN E PO Take 1 tablet by mouth daily.   Yes [provider]  methocarbamol (ROBAXIN) 500 MG tablet Take 1 tablet (500 mg total) by mouth every 8 (eight) hours as needed. Patient not taking: Reported on 04/07/2020 10/19/19   [name], [name], PA-C  naproxen (NAPROSYN) 500 MG tablet Take 1 tablet (500 mg total) by mouth 2 (two) times daily. Patient not taking: Reported on 04/07/2020 10/19/19   [name], [name], PA-C  sildenafil (VIAGRA) 50 MG tablet Take 0.5-1 tablets (25-50 mg total)  by mouth as needed for erectile dysfunction. Patient not taking: Reported on 04/07/2020 02/06/20   Wendie Agreste, MD  traMADol (ULTRAM) 50 MG tablet Take 1 tablet (50 mg total) by mouth every 6 (six) hours as needed. Patient not taking: Reported on 04/07/2020 02/15/17   Long, [name], MD  varenicline (CHANTIX CONTINUING MONTH PAK) 1 MG tablet Take 1 tablet (1 mg total) by mouth 2 (two) times daily. Patient not taking: Reported on 04/07/2020 02/06/20   Wendie Agreste, MD  varenicline (CHANTIX STARTING MONTH PAK) 0.5 MG X 11 & 1 MG X 42 tablet Take one 0.5 mg tablet by mouth  once daily for 3 days, then increase to one 0.5 mg tablet twice daily for 4 days, then increase to one 1 mg tablet twice daily. Patient not taking: Reported on 04/07/2020 02/06/20   Wendie Agreste, MD  omeprazole (PRILOSEC) 20 MG capsule Take 1 capsule (20 mg total) by mouth daily. Patient not taking: Reported on 10/19/2019 10/10/17 10/19/19  Joretta Bachelor, PA   Social History   Socioeconomic History  . Marital status: Divorced    Spouse name: Not on file  . Number of children: 0  . Years of education: Not on file  . Highest education level: Not on file  Occupational History  . Not on file  Tobacco Use  . Smoking status: Current Every Day Smoker    Packs/day: 0.50    Years: 30.00    Pack years: 15.00    Types: Cigarettes  . Smokeless tobacco: Never Used  . Tobacco comment: "not quite a pack per day"  Vaping Use  . Vaping Use: Former  Substance and Sexual Activity  . Alcohol use: Not Currently    Alcohol/week: 0.0 standard drinks    Comment: quit drinking 3 years ago  . Drug use: No  . Sexual activity: Not on file  Other Topics Concern  . Not on file  Social History Narrative  . Not on file   Social Determinants of Health   Financial Resource Strain:   . Difficulty of Paying Living Expenses:   Food Insecurity:   . Worried About Charity fundraiser in the Last Year:   . Arboriculturist in the Last Year:   Transportation Needs:   . Film/video editor (Medical):   Marland Kitchen Lack of Transportation (Non-Medical):   Physical Activity:   . Days of Exercise per Week:   . Minutes of Exercise per Session:   Stress:   . Feeling of Stress :   Social Connections:   . Frequency of Communication with Friends and Family:   . Frequency of Social Gatherings with Friends and Family:   . Attends Religious Services:   . Active Member of Clubs or Organizations:   . Attends Archivist Meetings:   Marland Kitchen Marital Status:   Intimate Partner Violence:   . Fear of Current or  Ex-Partner:   . Emotionally Abused:   Marland Kitchen Physically Abused:   . Sexually Abused:     Observations/Objective: There were no vitals filed for this visit. Speaking in normal sentences.  All questions answered, no distress.  Assessment and Plan: Upper back pain on right side  Muscle spasm of back Pain noted after playing golf day prior, possible strain/spasm of paraspinals, lower back symptoms have improved.  Still with pain into his right shoulder blade, potentially radicular pain from cervical spine but denies any current neck pain, denies any radicular pain down the  arms, weakness or dysesthesias.  -Trial of prednisone 40 mg daily x5 days, potential side effects and risks were discussed, and avoidance of NSAIDs with this medication.  -Flexeril 10 mg 3 times daily as needed, potential side effects discussed.  -Recheck in office in 1 week if not improving, sooner or to urgent care if worse.  Understanding expressed.   Follow Up Instructions:  1 week if not improved - sooner if worse.    I discussed the assessment and treatment plan with the patient. The patient was provided an opportunity to ask questions and all were answered. The patient agreed with the plan and demonstrated an understanding of the instructions.   The patient was advised to call back or seek an in-person evaluation if the symptoms worsen or if the condition fails to improve as anticipated.  I provided 21 minutes of non-face-to-face time during this encounter.   Wendie Agreste, MD

## 2020-04-09 ENCOUNTER — Telehealth: Payer: Self-pay | Admitting: Family Medicine

## 2020-04-09 NOTE — Telephone Encounter (Signed)
Patient called needing to know if it was safe to have his 2nd COVID-19 vaccination while taking Prednisone. Patient call was transferred to office for information.

## 2020-04-11 ENCOUNTER — Emergency Department (HOSPITAL_COMMUNITY)
Admission: EM | Admit: 2020-04-11 | Discharge: 2020-04-11 | Disposition: A | Discharge status: Home / Self Care | Payer: BC Managed Care – PPO | Attending: Emergency Medicine | Admitting: Emergency Medicine

## 2020-04-11 ENCOUNTER — Encounter (HOSPITAL_COMMUNITY): Payer: Self-pay

## 2020-04-11 ENCOUNTER — Emergency Department (HOSPITAL_COMMUNITY): Payer: BC Managed Care – PPO

## 2020-04-11 DIAGNOSIS — R202 Paresthesia of skin: Secondary | ICD-10-CM | POA: Insufficient documentation

## 2020-04-11 DIAGNOSIS — M25511 Pain in right shoulder: Secondary | ICD-10-CM | POA: Diagnosis not present

## 2020-04-11 DIAGNOSIS — F17210 Nicotine dependence, cigarettes, uncomplicated: Secondary | ICD-10-CM | POA: Diagnosis not present

## 2020-04-11 MED ORDER — METHOCARBAMOL 500 MG PO TABS: 500.0000 mg | ORAL_TABLET | Freq: Two times a day (BID) | ORAL | 0 refills | Status: AC

## 2020-04-11 MED ORDER — MELOXICAM 7.5 MG PO TABS: 7.5000 mg | ORAL_TABLET | Freq: Every day | ORAL | 0 refills | Status: AC

## 2020-04-11 NOTE — ED Provider Notes (Signed)
[facility] DEPT Provider Note   CSN: [id number] Arrival date & time: 04/11/20  1335     History Chief Complaint  Patient presents with  . Shoulder Pain    Jesse King is a 54 y.o. male who presents for evaluation of right shoulder pain has been ongoing for 5 days.  He states that initially, the pain was mild but has since progressively worsened.  He saw his primary care doctor and was prescribed Flexeril and prednisone.  He states that the prednisone helps the pain but when he gets to work, he starts having pain again.  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  He denies any preceding trauma, injury.  He feels like his shoulder has been swollen.  He states it has not been red or hot.  He has not measured any fevers.  He states he had a little tingling in his fingers earlier today with a since resolved.  He states that the pain is worse when he moves.  He is not taking any other medications.  The history is provided by the patient.       Past Medical History:  Diagnosis Date  . Chronic back pain    muscle spasms  . Fatty liver    Per CT scan   . GERD (gastroesophageal reflux disease)     Patient Active Problem List   Diagnosis Date Noted  . Nicotine addiction 08/16/2016  . Tobacco use disorder 08/16/2016    Past Surgical History:  Procedure Laterality Date  . APPENDECTOMY    . BACK SURGERY    . KNEE SURGERY    . WISDOM TOOTH EXTRACTION         Family History  Problem Relation Age of Onset  . Diabetes Father   . Heart disease Father   . Breast cancer Maternal Grandmother   . Lung cancer Maternal Grandmother   . Colon cancer Neg Hx   . Liver cancer Neg Hx   . Esophageal cancer Neg Hx   . Pancreatic cancer Neg Hx   . Prostate cancer Neg Hx   . Rectal cancer Neg Hx   . Stomach cancer Neg Hx     Social History   Tobacco Use  .  Smoking status: Current Every Day Smoker    Packs/day: 0.50    Years: 30.00    Pack years: 15.00    Types: Cigarettes  . Smokeless tobacco: Never Used  . Tobacco comment: "not quite a pack per day"  Vaping Use  . Vaping Use: Former  Substance Use Topics  . Alcohol use: Not Currently    Alcohol/week: 0.0 standard drinks    Comment: quit drinking 3 years ago  . Drug use: No    Home Medications Prior to Admission medications   Medication Sig Start Date End Date Taking? Authorizing Provider  ascorbic acid (VITAMIN C) 500 MG tablet Take 500 mg by mouth daily.    [provider]  BIOTIN PO Take 1 tablet by mouth daily.    [provider]  Cholecalciferol (VITAMIN D3 PO) Take 1 tablet by mouth daily.    [provider]  Cyanocobalamin (VITAMIN B-12 PO) Take 1 tablet by mouth daily.    [provider]  cyclobenzaprine (FLEXERIL) 5 MG tablet 1 pill by mouth up to every 8 hours as needed. Start with one pill by mouth each  bedtime as needed due to sedation 04/07/20   Wendie Agreste, MD  ibuprofen (ADVIL,MOTRIN) 800 MG tablet Take 1 tablet (800 mg total) by mouth every 8 (eight) hours as needed. 02/15/17   Long, [name], MD  meloxicam (MOBIC) 7.5 MG tablet Take 1 tablet (7.5 mg total) by mouth daily for 10 days. 04/11/20 04/21/20  Volanda Napoleon, PA-C  methocarbamol (ROBAXIN) 500 MG tablet Take 1 tablet (500 mg total) by mouth 2 (two) times daily. 04/11/20   Volanda Napoleon, PA-C  predniSONE (DELTASONE) 20 MG tablet Take 2 tablets (40 mg total) by mouth daily with breakfast. 04/07/20   Wendie Agreste, MD  VITAMIN E PO Take 1 tablet by mouth daily.    [provider]  omeprazole (PRILOSEC) 20 MG capsule Take 1 capsule (20 mg total) by mouth daily. Patient not taking: Reported on 10/19/2019 10/10/17 10/19/19  [name], PA    Allergies    Codeine, Tamiflu [oseltamivir], and Amoxicillin  Review of Systems   Review of Systems    Musculoskeletal:       Right shoulder pain  Neurological: Positive for numbness (intermittent). Negative for weakness.  All other systems reviewed and are negative.   Physical Exam Updated Vital Signs BP (!) 136/82 (BP Location: Left Arm)   Pulse 98   Temp 98.3 F (36.8 C) (Oral)   Resp 20   Ht 5\' 7"  (1.702 m)   Wt 68 kg   SpO2 100%   BMI 23.49 kg/m   Physical Exam Vitals and nursing note reviewed.  Constitutional:      Appearance: He is well-developed.  HENT:     Head: Normocephalic and atraumatic.  Eyes:     General: No scleral icterus.       Right eye: No discharge.        Left eye: No discharge.     Conjunctiva/sclera: Conjunctivae normal.  Neck:     Comments: No midline C-spine tenderness.  Positive Spurling maneuver. Cardiovascular:     Pulses:          Radial pulses are 2+ on the right side and 2+ on the left side.  Pulmonary:     Effort: Pulmonary effort is normal.  Musculoskeletal:       Arms:     Comments: Tenderness palpation posterior aspect of the right shoulder.  No overlying warmth, erythema, edema.  No bony deformity or crepitus noted.  Limited range of motion secondary to pain.  Positive Neer's impingement, Hawkins.  Unable to complete liftoff test.  Positive empty can test.  No bony tenderness noted to left upper extremity.  Negative Neer's, Hawkins, liftoff, empty can test.  Skin:    General: Skin is warm and dry.     Comments: Good distal cap refill. RUE is not dusky in appearance or cool to touch.  Neurological:     Mental Status: He is alert.     Comments: Sensation intact along major nerve distributions of BUE Equal grip strength bilaterally 5/5 strength of bilateral upper extremity.  Psychiatric:        Speech: Speech normal.        Behavior: Behavior normal.     ED Results / Procedures / Treatments   Labs (all labs ordered are listed, but only abnormal results are displayed) Labs Reviewed - No data to  display  EKG None  Radiology DG Shoulder Right  Result Date: 04/11/2020 CLINICAL DATA:  Right shoulder pain of several days duration. EXAM:  RIGHT SHOULDER - 2+ VIEW COMPARISON:  None. FINDINGS: There is no evidence of fracture or dislocation. There is no evidence of arthropathy or other focal bone abnormality. Soft tissues are unremarkable. IMPRESSION: Negative. Electronically Signed   By: Nelson Chimes M.D.   On: 04/11/2020 14:45    Procedures Procedures (including critical care time)  Medications Ordered in ED Medications - No data to display  ED Course  I have reviewed the triage vital signs and the nursing notes.  Pertinent labs & imaging results that were available during my care of the patient were reviewed by me and considered in my medical decision making (see chart for details).    MDM Rules/Calculators/A&P                          54 year old male who presents for evaluation of right shoulder pain x5 days.  Seen by PCP and was prescribed prednisone and Flexeril which he states has been he has been taking.  He does report some improvement in pain but states that when he goes to work, the pain worsens.  He does do a lot of heavy lifting, moving with his arm.  He has had some intermittent tingling to his fingers this morning but states that that resolved.  No preceding trauma, injury, fall.  No fevers.  Initially arrival, he is afebrile, nontoxic-appearing.  Vital signs are stable.  He does have positive Neer's, Hawkins.  He is unable to complete liftoff test.  He also has positive empty can test.  Concern for rotator cuff pathology such as rotator cuff impingement versus rotator cuff tear.  Also consider tendinitis versus musculoskeletal pain.  History/physical exam not concerning for septic arthritis, ischemic limb, DVT of upper extremity.  Doubt fracture dislocation given lack of trauma.  X-rays ordered at triage.  X-rays reviewed.  Negative for any acute bony  abnormality.  Discussed results with patient.  Patient is requesting more muscle relaxers.  He still has prednisone today.  Encouraged him to continue taking prednisone.  Will refill his muscle relaxers.  Patient given a sling for support and comfort.  I encouraged patient to follow-up with outpatient Ortho. At this time, patient exhibits no emergent life-threatening condition that require further evaluation in ED ora dmission. Patient had ample opportunity for questions and discussion. All patient's questions were answered with full understanding. Strict return precautions discussed. Patient expresses understanding and agreement to plan.   Portions of this note were generated with Lobbyist. Dictation errors may occur despite best attempts at proofreading.  Final Clinical Impression(s) / ED Diagnoses Final diagnoses:  Acute pain of right shoulder    Rx / DC Orders ED Discharge Orders         Ordered    methocarbamol (ROBAXIN) 500 MG tablet  2 times daily     Discontinue  Reprint     04/11/20 1935    meloxicam (MOBIC) 7.5 MG tablet  Daily     Discontinue  Reprint     04/11/20 1935           Volanda Napoleon, PA-C 04/11/20 2005    Deno Etienne, DO 04/11/20 2247

## 2020-04-11 NOTE — Discharge Instructions (Signed)
Take Robaxin as prescribed. This medication will make you drowsy so do not drive or drink alcohol when taking it.  He can take meloxicam as directed.  We have provided you with a sling for support and stabilization.  You should not wear this 24/7 as it can lead to frozen shoulder.  He should routinely get the sling off and do gentle range of motion exercises.  I provided you outpatient referral to orthopedics that you can follow-up with.  Return the emergency department for any worsening pain, numbness/weakness of your arm, redness or swelling of the shoulder or any other worsening or concerning symptoms.

## 2020-04-11 NOTE — ED Triage Notes (Signed)
Pt c/o right shoulder pain x several days. Pt states that he saw his PCP and was given rx for prednisone and muscle relaxers. Pt states that the muscle relaxers help at night, but during the day the pain is "unbearable". Pt denies injury/trauma. Pt states pain started shortly after playing golf with his brother.

## 2020-04-11 NOTE — ED Notes (Signed)
Pt left before discharge vital signs 

## 2020-04-14 NOTE — ED Notes (Signed)
Opened chat at patients request to clarify discharge work note.

## 2020-04-16 DIAGNOSIS — M25511 Pain in right shoulder: Secondary | ICD-10-CM | POA: Diagnosis not present

## 2020-04-16 DIAGNOSIS — M542 Cervicalgia: Secondary | ICD-10-CM | POA: Diagnosis not present

## 2021-02-10 IMAGING — CR DG SHOULDER 2+V*R*
3 series · 3 of 3 positions shown · non-contrast
Comparison: None.

CLINICAL DATA: Right shoulder pain of several days duration.

EXAM:
RIGHT SHOULDER - 2+ VIEW

[w shoulder external right]
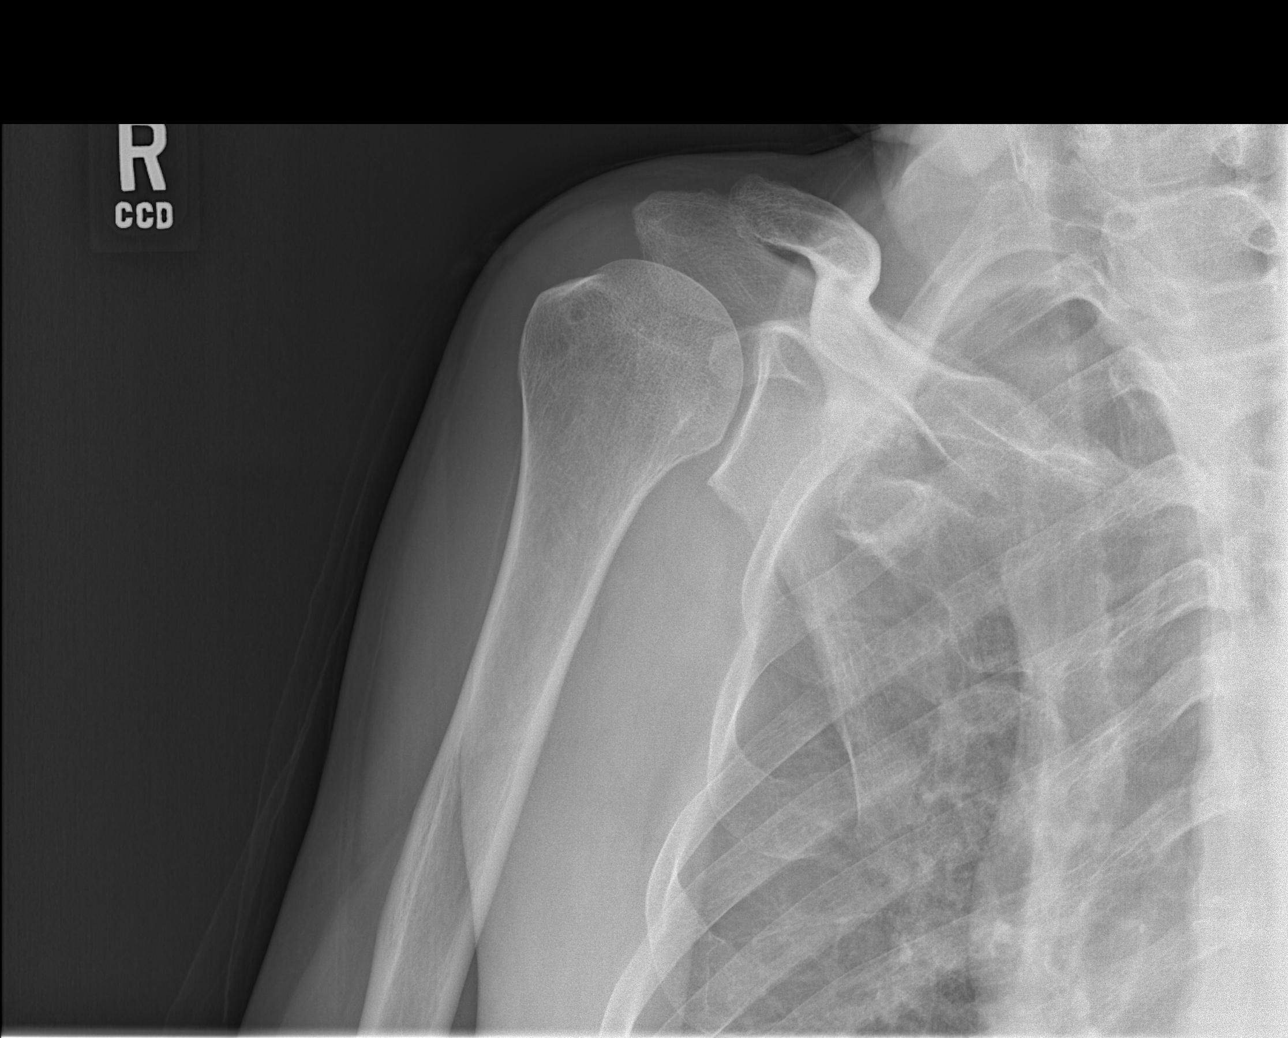

[w shoulder y-view right]
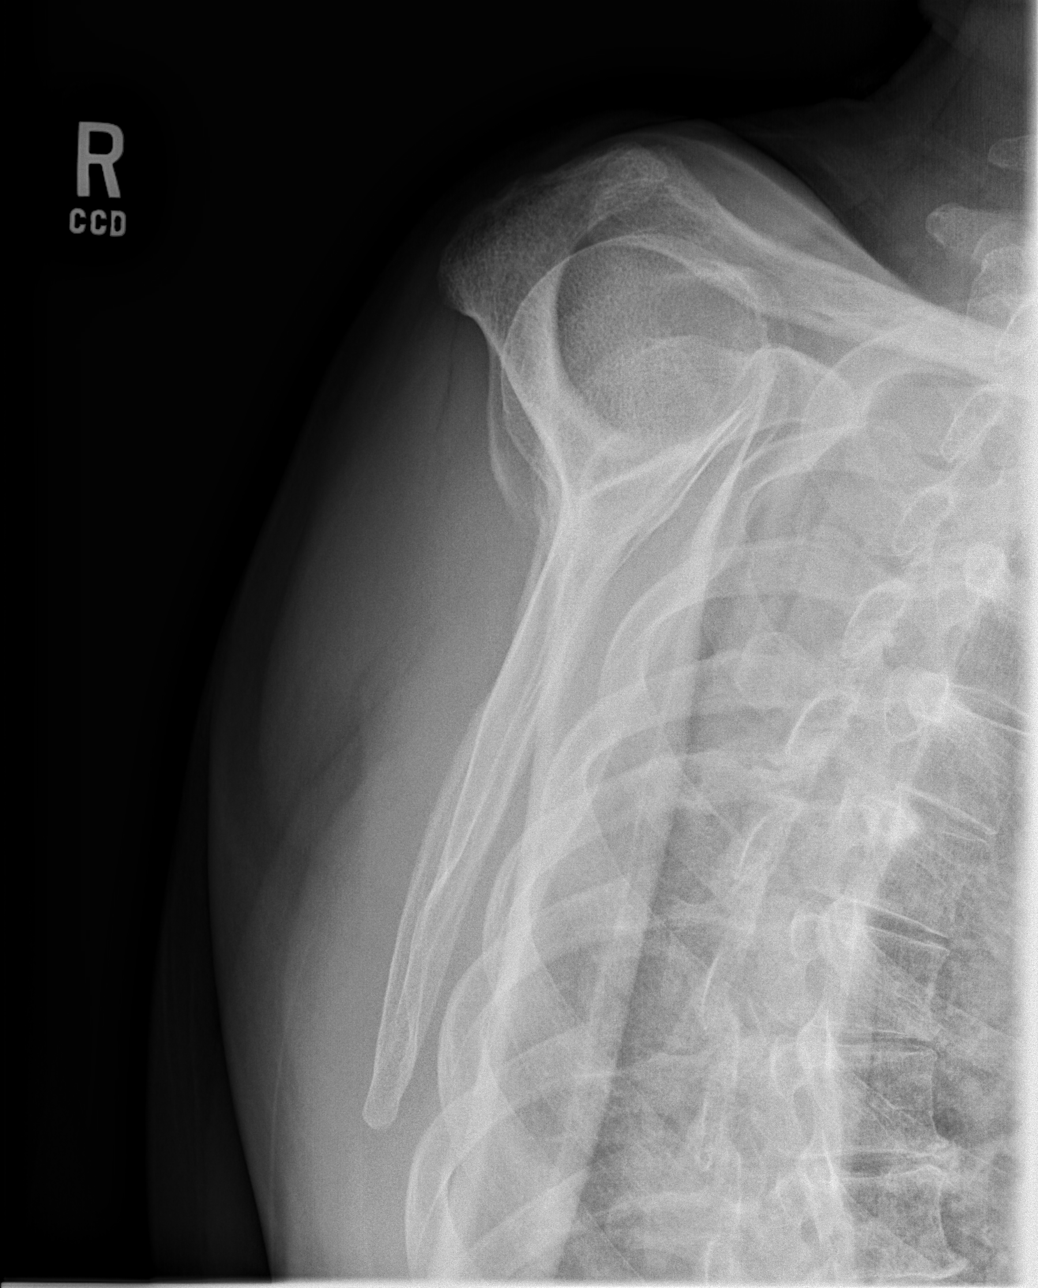

[x shoulder axillary right]
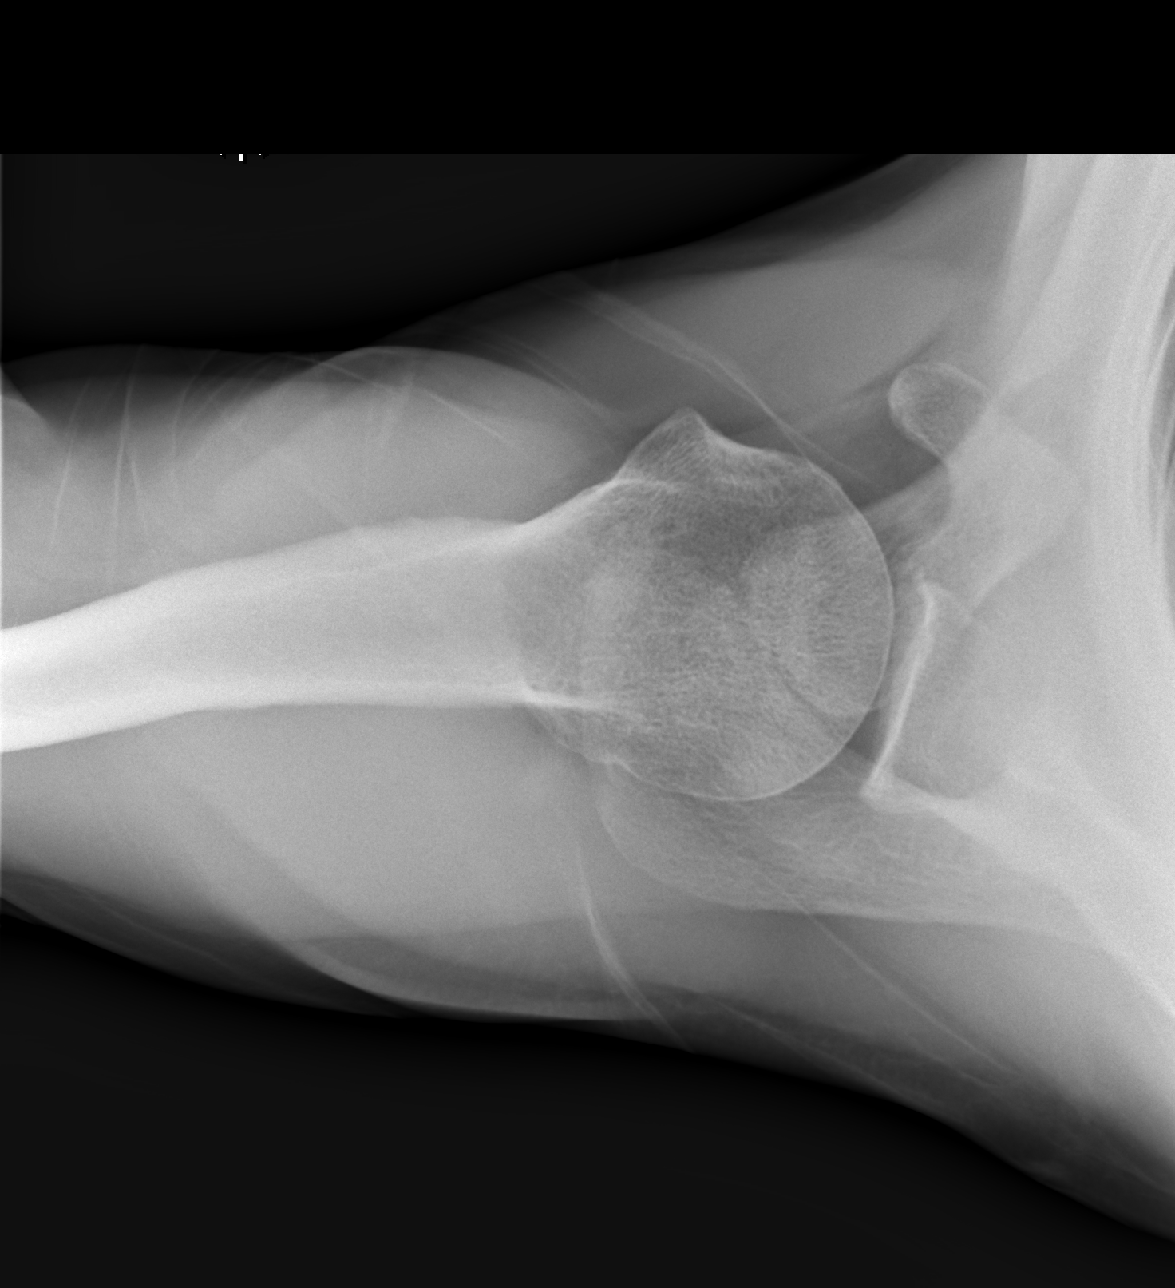

[3 of 3 positions shown; findings below may reference images not displayed]

FINDINGS: There is no evidence of fracture or dislocation. There is no
evidence of arthropathy or other focal bone abnormality. Soft
tissues are unremarkable.
IMPRESSION: Negative.

## 2021-04-28 ENCOUNTER — Encounter: Payer: Self-pay | Admitting: Gastroenterology

## 2022-06-18 ENCOUNTER — Ambulatory Visit: Payer: Self-pay

## 2022-06-18 ENCOUNTER — Other Ambulatory Visit: Payer: Self-pay | Admitting: Family Medicine

## 2022-06-18 DIAGNOSIS — M25562 Pain in left knee: Secondary | ICD-10-CM

## 2023-01-24 ENCOUNTER — Emergency Department (HOSPITAL_COMMUNITY): Payer: BC Managed Care – PPO

## 2023-01-24 ENCOUNTER — Other Ambulatory Visit: Payer: Self-pay

## 2023-01-24 ENCOUNTER — Emergency Department (HOSPITAL_COMMUNITY)
Admission: EM | Admit: 2023-01-24 | Discharge: 2023-01-24 | Disposition: A | Discharge status: Home / Self Care | Payer: BC Managed Care – PPO | Attending: Emergency Medicine | Admitting: Emergency Medicine

## 2023-01-24 DIAGNOSIS — R739 Hyperglycemia, unspecified: Secondary | ICD-10-CM | POA: Insufficient documentation

## 2023-01-24 DIAGNOSIS — R051 Acute cough: Secondary | ICD-10-CM | POA: Insufficient documentation

## 2023-01-24 DIAGNOSIS — D72829 Elevated white blood cell count, unspecified: Secondary | ICD-10-CM | POA: Diagnosis not present

## 2023-01-24 DIAGNOSIS — R091 Pleurisy: Secondary | ICD-10-CM | POA: Diagnosis not present

## 2023-01-24 DIAGNOSIS — R079 Chest pain, unspecified: Secondary | ICD-10-CM | POA: Diagnosis present

## 2023-01-24 DIAGNOSIS — R072 Precordial pain: Secondary | ICD-10-CM

## 2023-01-24 DIAGNOSIS — R0781 Pleurodynia: Secondary | ICD-10-CM | POA: Diagnosis not present

## 2023-01-24 LAB — COMPREHENSIVE METABOLIC PANEL
ALT: 12 U/L (ref 0–44)
AST: 14 U/L — ABNORMAL LOW (ref 15–41)
Albumin: 3.8 g/dL (ref 3.5–5.0)
Alkaline Phosphatase: 81 U/L (ref 38–126)
Anion gap: 9 (ref 5–15)
BUN: 14 mg/dL (ref 6–20)
CO2: 24 mmol/L (ref 22–32)
Calcium: 8.9 mg/dL (ref 8.9–10.3)
Chloride: 106 mmol/L (ref 98–111)
Creatinine, Ser: 0.91 mg/dL (ref 0.61–1.24)
GFR, Estimated: >60 mL/min (ref >60)
Glucose, Bld: 128 mg/dL — ABNORMAL HIGH (ref 70–99)
Potassium: 3.5 mmol/L (ref 3.5–5.1)
Sodium: 139 mmol/L (ref 135–145)
Total Bilirubin: 0.9 mg/dL (ref 0.3–1.2)
Total Protein: 7.3 g/dL (ref 6.5–8.1)

## 2023-01-24 LAB — D-DIMER, QUANTITATIVE: D-Dimer, Quant: <0.27 ug/mL-FEU (ref 0.00–0.50)

## 2023-01-24 LAB — CBC
HCT: 46.1 % (ref 39.0–52.0)
Hemoglobin: 15.1 g/dL (ref 13.0–17.0)
MCH: 32.3 pg (ref 26.0–34.0)
MCHC: 32.8 g/dL (ref 30.0–36.0)
MCV: 98.5 fL (ref 80.0–100.0)
Platelets: 254 10*3/uL (ref 150–400)
RBC: 4.68 MIL/uL (ref 4.22–5.81)
RDW: 13.2 % (ref 11.5–15.5)
WBC: 11 10*3/uL — ABNORMAL HIGH (ref 4.0–10.5)
nRBC: 0 % (ref 0.0–0.2)

## 2023-01-24 LAB — TROPONIN I (HIGH SENSITIVITY): Troponin I (High Sensitivity): 3 ng/L (ref <18)

## 2023-01-24 MED ORDER — MORPHINE SULFATE (PF) 4 MG/ML IV SOLN
4.0000 mg | Freq: Once | INTRAVENOUS | Status: AC
  Administered 2023-01-24: 4 mg via INTRAVENOUS
  Filled 2023-01-24: qty 1

## 2023-01-24 MED ORDER — KETOROLAC TROMETHAMINE 15 MG/ML IJ SOLN
15.0000 mg | Freq: Once | INTRAMUSCULAR | Status: AC
  Administered 2023-01-24: 15 mg via INTRAVENOUS
  Filled 2023-01-24: qty 1

## 2023-01-24 MED ORDER — HYDROCOD POLI-CHLORPHE POLI ER 10-8 MG/5ML PO SUER: 5.0000 mL | Freq: Two times a day (BID) | ORAL | 0 refills | Status: AC | PRN

## 2023-01-24 MED ORDER — MORPHINE SULFATE (PF) 2 MG/ML IV SOLN
2.0000 mg | Freq: Once | INTRAVENOUS | Status: AC
  Administered 2023-01-24: 2 mg via INTRAVENOUS
  Filled 2023-01-24: qty 1

## 2023-01-24 NOTE — ED Provider Notes (Signed)
[facility] EMERGENCY DEPARTMENT AT [facility] Provider Note   CSN: [id number] Arrival date & time: 01/24/23  0715     History  Chief Complaint  Patient presents with   Chest Pain    Jesse King is a 57 y.o. male with past medical history significant for tobacco abuse disorder, hyperlipidemia who presents with concern for right-sided chest pain that radiates to right shoulder for the last 3 days.  Patient was seen in urgent care, told that he may have costochondritis.  Patient reports that he has had an cough, some congestion for around a week.  Patient denies any fever, chills, nausea, vomiting.  He denies any abdominal pain, diarrhea.  He reports the pain is worse with movement, improved with standing.  He reports that it feels like twisting/sharp.  He denies worse with exertion, he denies pleuritic chest pain.  No previous history of blood clots, no leg pain, no recent travel.   Chest Pain      Home Medications Prior to Admission medications   Medication Sig Start Date End Date Taking? Authorizing Provider  ascorbic acid (VITAMIN C) 500 MG tablet Take 500 mg by mouth daily.   Yes [provider]  chlorpheniramine-HYDROcodone (TUSSIONEX) 10-8 MG/5ML Take 5 mLs by mouth every 12 (twelve) hours as needed for cough. 01/24/23  Yes Ryle Buscemi H, PA-C  ezetimibe (ZETIA) 10 MG tablet Take 10 mg by mouth daily. 11/25/22  Yes [provider]  ibuprofen (ADVIL,MOTRIN) 800 MG tablet Take 1 tablet (800 mg total) by mouth every 8 (eight) hours as needed. 02/15/17  Yes Long, Arlyss Repress, MD  sildenafil (VIAGRA) 100 MG tablet Take 100 mg by mouth daily. 12/21/22  Yes [provider]  tadalafil (CIALIS) 5 MG tablet Take 5 mg by mouth daily. 12/23/22  Yes [provider]  VITAMIN E PO Take 1 tablet by mouth daily.   Yes [provider]  cyclobenzaprine (FLEXERIL) 5 MG tablet 1 pill by mouth up to every 8 hours as needed. Start with one pill  by mouth each bedtime as needed due to sedation Patient not taking: Reported on 01/24/2023 04/07/20   Shade Flood, MD  methocarbamol (ROBAXIN) 500 MG tablet Take 1 tablet (500 mg total) by mouth 2 (two) times daily. Patient not taking: Reported on 01/24/2023 04/11/20   Maxwell Caul, PA-C  predniSONE (DELTASONE) 20 MG tablet Take 2 tablets (40 mg total) by mouth daily with breakfast. Patient not taking: Reported on 01/24/2023 04/07/20   Shade Flood, MD  omeprazole (PRILOSEC) 20 MG capsule Take 1 capsule (20 mg total) by mouth daily. Patient not taking: Reported on 10/19/2019 10/10/17 10/19/19  Trena Platt D, PA      Allergies    Codeine, Tamiflu [oseltamivir], and Amoxicillin    Review of Systems   Review of Systems  Cardiovascular:  Positive for chest pain.  All other systems reviewed and are negative.   Physical Exam Updated Vital Signs BP 107/74   Pulse 65   Temp 98.3 F (36.8 C) (Oral)   Resp 19   Ht 5\' 7"  (1.702 m)   Wt 68.5 kg   SpO2 98%   BMI 23.65 kg/m  Physical Exam Vitals and nursing note reviewed.  Constitutional:      General: He is not in acute distress.    Appearance: Normal appearance.  HENT:     Head: Normocephalic and atraumatic.  Eyes:     General:  Right eye: No discharge.        Left eye: No discharge.  Cardiovascular:     Rate and Rhythm: Normal rate and regular rhythm.     Heart sounds: No murmur heard.    No friction rub. No gallop.  Pulmonary:     Effort: Pulmonary effort is normal.     Breath sounds: Normal breath sounds.  Chest:     Comments: Tenderness to palpation of right-sided chest wall  Abdominal:     General: Bowel sounds are normal.     Palpations: Abdomen is soft.  Skin:    General: Skin is warm and dry.     Capillary Refill: Capillary refill takes less than 2 seconds.  Neurological:     Mental Status: He is alert and oriented to person, place, and time.  Psychiatric:        Mood and Affect: Mood normal.         Behavior: Behavior normal.     ED Results / Procedures / Treatments   Labs (all labs ordered are listed, but only abnormal results are displayed) Labs Reviewed  CBC - Abnormal; Notable for the following components:      Result Value   WBC 11.0 (*)    All other components within normal limits  COMPREHENSIVE METABOLIC PANEL - Abnormal; Notable for the following components:   Glucose, Bld 128 (*)    AST 14 (*)    All other components within normal limits  D-DIMER, QUANTITATIVE  TROPONIN I (HIGH SENSITIVITY)    EKG EKG Interpretation  Date/Time:  Monday Jan 24 2023 07:26:44 EDT Ventricular Rate:  80 PR Interval:  133 QRS Duration: 94 QT Interval:  364 QTC Calculation: 420 R Axis:   65 Text Interpretation: Sinus rhythm RSR' in V1 or V2, right VCD or RVH Confirmed by Glyn Ade [phone]) on 01/24/2023 8:29:06 AM  Radiology DG Chest 2 View  Result Date: 01/24/2023 CLINICAL DATA:  Chest pain EXAM: CHEST - 2 VIEW COMPARISON:  10/19/2019 FINDINGS: Cardiac size is within normal limits. There are no signs of pulmonary edema or new focal infiltrates. There is no pleural effusion or pneumothorax. IMPRESSION: There are no new infiltrates or signs of pulmonary edema. Electronically Signed   By: Ernie Avena M.D.   On: 01/24/2023 08:03    Procedures Procedures    Medications Ordered in ED Medications  ketorolac (TORADOL) 15 MG/ML injection 15 mg (15 mg Intravenous Given 01/24/23 0744)  morphine (PF) 2 MG/ML injection 2 mg (2 mg Intravenous Given 01/24/23 0744)  morphine (PF) 4 MG/ML injection 4 mg (4 mg Intravenous Given 01/24/23 6045)    ED Course/ Medical Decision Making/ A&P                             Medical Decision Making  This patient is a 57 y.o. male who presents to the ED for concern of chest pain, this involves an extensive number of treatment options, and is a complaint that carries with it a high risk of complications and morbidity. The emergent  differential diagnosis prior to evaluation includes, but is not limited to,  ACS, AAS, PE, Mallory-Weiss, Boerhaave's, Pneumonia, acute bronchitis, asthma or COPD exacerbation, anxiety, MSK pain or traumatic injury to the chest, acid reflux versus other . This is not an exhaustive differential.   Past Medical History / Co-morbidities / Social History: Tobacco abuse, patient reports 1 pack/day smoking use.  He denies any  history of hypertension, does endorse hyperlipidemia.  Physical Exam: Physical exam performed. The pertinent findings include: Overall well-appearing with some reproducible chest wall tenderness on the right  Lab Tests: I ordered, and personally interpreted labs.  The pertinent results include: Troponin negative x 1 in context of chest pain ongoing for several days, his D-dimer is negative, overall low clinical suspicion of acute PE in this context.  CBC with very mild leukocytosis, white blood cells 11.0.  He is very mild hyperglycemia, glucose 128.  Other lab values and CMP are unremarkable.   Imaging Studies: I ordered imaging studies including chest x-ray. I independently visualized and interpreted imaging which showed no acute intrathoracic abnormality. I agree with the radiologist interpretation.   Cardiac Monitoring:  The patient was maintained on a cardiac monitor.  My attending physician Dr. Doran Durand viewed and interpreted the cardiac monitored which showed an underlying rhythm of: NSR. I agree with this interpretation.   Medications: I ordered medication including Toradol, morphine for pain. Reevaluation of the patient after these medicines showed that the patient improved. I have reviewed the patients home medicines and have made adjustments as needed.   Disposition: After consideration of the diagnostic results and the patients response to treatment, I feel that patient with right-sided chest pain, nonexertional in nature, does not seem to be pleuritic, his troponin  is unremarkable x 1 in context of chest pain ongoing for greater than 6 hours, he is chest pain-free after pain control, his D-dimer is negative, no clinical signs symptoms of PE, his case seems suggestive of costochondritis versus pleurisy, versus other musculoskeletal chest pain.Marland Kitchen   emergency department workup does not suggest an emergent condition requiring admission or immediate intervention beyond what has been performed at this time. The plan is: As above discharge with medicated cough syrup, pain control, encourage close PCP follow-up. The patient is safe for discharge and has been instructed to return immediately for worsening symptoms, change in symptoms or any other concerns.  I discussed this case with my attending physician Dr. Doran Durand who cosigned this note including patient's presenting symptoms, physical exam, and planned diagnostics and interventions. Attending physician stated agreement with plan or made changes to plan which were implemented.    Final Clinical Impression(s) / ED Diagnoses Final diagnoses:  Acute cough  Pleurisy  Precordial pain    Rx / DC Orders ED Discharge Orders          Ordered    chlorpheniramine-HYDROcodone (TUSSIONEX) 10-8 MG/5ML  Every 12 hours PRN        01/24/23 1114              Gevon Markus, [place] H, PA-C 01/24/23 1128    Glyn Ade, MD 01/25/23 [phone]

## 2023-01-24 NOTE — Discharge Instructions (Addendum)
Please use Tylenol or ibuprofen for pain.  You may use 600 mg ibuprofen every 6 hours or 1000 mg of Tylenol every 6 hours.  You may choose to alternate between the 2.  This would be most effective.  Not to exceed 4 g of Tylenol within 24 hours.  Not to exceed 3200 mg ibuprofen 24 hours.  Based on your findings today there is no evidence of any issue with your heart, lungs, I suspect that you are having some musculoskeletal pain in the chest wall secondary to your coughing or some inflammation of your lungs.  This will improve over time, as your cough resolves.  Even use the pain management plan above, as well as use the medicated cough syrup I prescribed.

## 2023-01-24 NOTE — ED Triage Notes (Signed)
Pt reports right sided chest pain that radiates to right shoulder x 3 days.

## 2024-09-22 ENCOUNTER — Ambulatory Visit: Admission: EM | Admit: 2024-09-22 | Discharge: 2024-09-22 | Disposition: A | Discharge status: Home / Self Care

## 2024-09-22 ENCOUNTER — Encounter: Payer: Self-pay | Admitting: *Deleted

## 2024-09-22 DIAGNOSIS — J111 Influenza due to unidentified influenza virus with other respiratory manifestations: Secondary | ICD-10-CM | POA: Diagnosis not present

## 2024-09-22 NOTE — ED Provider Notes (Addendum)
 " [facility]    CSN: [id number] Arrival date & time: 09/22/24  1033      History   Chief Complaint Chief Complaint  Patient presents with   Nasal Congestion    HPI Jesse King is a 59 y.o. male.   Pt presents today due to nasal congestion, body aches, cough, and headache since Thursday. Pt states that he was in contact with sick people when he was at his girlfriend's house. Pt states that he has been using Alka Seltzer cold for symptoms without relief. Pt denies fever or loss of appetite. Pt declines testing for covid, influenza testing not available.   The history is provided by the patient.    Past Medical History:  Diagnosis Date   Chronic back pain    muscle spasms   Fatty liver    Per CT scan    GERD (gastroesophageal reflux disease)     Patient Active Problem List   Diagnosis Date Noted   Nicotine  addiction 08/16/2016   Tobacco use disorder 08/16/2016    Past Surgical History:  Procedure Laterality Date   APPENDECTOMY     BACK SURGERY     KNEE SURGERY     WISDOM TOOTH EXTRACTION         Home Medications    Prior to Admission medications  Medication Sig Start Date End Date Taking? Authorizing Provider  ascorbic acid (VITAMIN C) 500 MG tablet Take 500 mg by mouth daily.   Yes [provider]  cyclobenzaprine  (FLEXERIL ) 5 MG tablet 1 pill by mouth up to every 8 hours as needed. Start with one pill by mouth each bedtime as needed due to sedation 04/07/20  Yes Levora Reyes SAUNDERS, MD  SUMAtriptan (IMITREX) 100 MG tablet Take 100 mg by mouth.   Yes [provider]  tadalafil (CIALIS) 5 MG tablet Take 5 mg by mouth daily. 12/23/22  Yes [provider]  chlorpheniramine-HYDROcodone (TUSSIONEX) 10-8 MG/5ML Take 5 mLs by mouth every 12 (twelve) hours as needed for cough. Patient not taking: Reported on 09/22/2024 01/24/23   Prosperi, Christian H, PA-C  colchicine 0.6 MG tablet Take 0.6 mg by mouth daily.    [provider]  ezetimibe (ZETIA) 10 MG tablet Take 10 mg by mouth daily. Patient not taking: Reported on 09/22/2024 11/25/22   [provider]  Hyoscyamine Sulfate SL 0.125 MG SUBL Take 1 tablet by mouth 4 (four) times daily. Patient not taking: Reported on 09/22/2024 06/28/18   [provider]  ibuprofen  (ADVIL ,MOTRIN ) 800 MG tablet Take 1 tablet (800 mg total) by mouth every 8 (eight) hours as needed. Patient not taking: Reported on 09/22/2024 02/15/17   Long, Joshua G, MD  lactulose (CHRONULAC) 10 GM/15ML solution Take 20 g by mouth 2 (two) times daily. Patient not taking: Reported on 09/22/2024 07/16/24   [provider]  methocarbamol  (ROBAXIN ) 500 MG tablet Take 1 tablet (500 mg total) by mouth 2 (two) times daily. Patient not taking: Reported on 01/24/2023 04/11/20   Layden, Lindsey A, PA-C  ondansetron  (ZOFRAN -ODT) 8 MG disintegrating tablet Take 8 mg by mouth 2 (two) times daily. Patient not taking: Reported on 09/22/2024    [provider]  predniSONE  (DELTASONE ) 20 MG tablet Take 2 tablets (40 mg total) by mouth daily with breakfast. Patient not taking: Reported on 01/24/2023 04/07/20   Levora Reyes SAUNDERS, MD  sildenafil  (VIAGRA ) 100 MG tablet Take 100 mg by mouth daily. Patient not taking: Reported on 09/22/2024 12/21/22  [provider]  VITAMIN E PO Take 1 tablet by mouth daily.    [provider]  omeprazole  (PRILOSEC) 20 MG capsule Take 1 capsule (20 mg total) by mouth daily. Patient not taking: Reported on 10/19/2019 10/10/17 10/19/19  Isadora Corean BIRCH, PA    Family History Family History  Problem Relation Age of Onset   Diabetes Father    Heart disease Father    Breast cancer Maternal Grandmother    Lung cancer Maternal Grandmother    Colon cancer Neg Hx    Liver cancer Neg Hx    Esophageal cancer Neg Hx    Pancreatic cancer Neg Hx    Prostate cancer Neg Hx    Rectal cancer Neg Hx    Stomach cancer Neg Hx     Social  History Social History[1]   Allergies   Acetaminophen, Codeine, Oseltamivir phosphate, Tamiflu [oseltamivir], and Amoxicillin   Review of Systems Review of Systems   Physical Exam Triage Vital Signs ED Triage Vitals  Encounter Vitals Group     BP 09/22/24 1104 (!) 112/59     Girls Systolic BP Percentile --      Girls Diastolic BP Percentile --      Boys Systolic BP Percentile --      Boys Diastolic BP Percentile --      Pulse Rate 09/22/24 1104 (!) 103     Resp 09/22/24 1104 18     Temp 09/22/24 1104 99.1 F (37.3 C)     Temp Source 09/22/24 1104 Oral     SpO2 09/22/24 1104 94 %     Weight --      Height --      Head Circumference --      Peak Flow --      Pain Score 09/22/24 1105 4     Pain Loc --      Pain Education --      Exclude from Growth Chart --    No data found.  Updated Vital Signs BP (!) 112/59 (BP Location: Left Arm)   Pulse (!) 103   Temp 99.1 F (37.3 C) (Oral)   Resp 18   SpO2 94%   Visual Acuity Right Eye Distance:   Left Eye Distance:   Bilateral Distance:    Right Eye Near:   Left Eye Near:    Bilateral Near:     Physical Exam Vitals and nursing note reviewed.  Constitutional:      General: He is not in acute distress.    Appearance: Normal appearance. He is not ill-appearing, toxic-appearing or diaphoretic.  HENT:     Nose: Congestion (moderately enlarged turbinates) present. No rhinorrhea.     Mouth/Throat:     Mouth: Mucous membranes are moist.     Pharynx: Oropharynx is clear. No oropharyngeal exudate or posterior oropharyngeal erythema.  Eyes:     General: No scleral icterus. Cardiovascular:     Rate and Rhythm: Normal rate and regular rhythm.     Heart sounds: Normal heart sounds.  Pulmonary:     Effort: Pulmonary effort is normal. No respiratory distress.     Breath sounds: Normal breath sounds. No wheezing or rhonchi.  Skin:    General: Skin is warm.  Neurological:     Mental Status: He is alert and oriented to  person, place, and time.  Psychiatric:        Mood and Affect: Mood normal.        Behavior: Behavior normal.  UC Treatments / Results  Labs (all labs ordered are listed, but only abnormal results are displayed) Labs Reviewed - No data to display  EKG   Radiology No results found.  Procedures Procedures (including critical care time)  Medications Ordered in UC Medications - No data to display  Initial Impression / Assessment and Plan / UC Course  I have reviewed the triage vital signs and the nursing notes.  Pertinent labs & imaging results that were available during my care of the patient were reviewed by me and considered in my medical decision making (see chart for details).     Final Clinical Impressions(s) / UC Diagnoses   Final diagnoses:  Influenza-like illness     Discharge Instructions      You been diagnosed with a viral illness today. -Viruses have to run their course and medicines that are prescribed are meant to help with symptoms. - With viruses usually feel poorly from 3 to 7 days with cough being the last symptoms to resolve.  -Cough can linger from days to weeks.  Antibiotics are not effective for viruses. -If your cough lasts more than 2 weeks and you are coughing so hard that you are vomiting or feel like you could pass out we need to follow-up with PCP for further testing and evaluation. -Rest, increase water intake, may use pseudoephedrine for nasal congestion, Delsym (dextromethorphan) or honey as needed for cough, and ibuprofen  and/or Tylenol as directed on packaging for pain and fever. -If you have hypertension you should take Coricidin or other OTC meds approved for people with high blood pressure. -You may use a spoonful of honey every 4-6 hours as needed for throat pain and cough. -Warm tea with honey and lemon are helpful for soothe throat as well.  Chloraseptic and Cepacol make a throat lozenge with numbing medication, can be  purchased over-the-counter. -May also use Flonase  or sinus rinse for sinus pressure or nasal congestion.  Be sure to use distilled bottled water for sinus rinses. -May use coolmist humidifier to open up nasal passages -May elevate head to assist with postnasal drainage. -If you feel poorly (fever, fatigue, shortness of breath, nausea, etc.) for more than 10 days to be sure to follow-up with PCP or in clinic for further evaluation and additional treatments. If you experience chest pain with shortness of breath or pulse oxygen less than 95% you should report to the ER.     ED Prescriptions   None    PDMP not reviewed this encounter.    Andra Corean BROCKS, PA-C 09/22/24 1132     [1]  Social History Tobacco Use   Smoking status: Every Day    Current packs/day: 0.50    Average packs/day: 0.5 packs/day for 30.0 years (15.0 ttl pk-yrs)    Types: Cigarettes   Smokeless tobacco: Never   Tobacco comments:    not quite a pack per day  Vaping Use   Vaping status: Former  Substance Use Topics   Alcohol use: Not Currently    Alcohol/week: 0.0 standard drinks of alcohol    Comment: quit drinking 3 years ago   Drug use: No     Andra Corean BROCKS, PA-C 09/22/24 1133  "

## 2024-09-22 NOTE — ED Triage Notes (Signed)
 Pt reports congestion, headache, body aches and cough since Thursday. Taking alka-seltzer cold plus with minimal relief

## 2024-09-22 NOTE — Discharge Instructions (Addendum)
